# Patient Record
Sex: Female | Born: 1945 | Hispanic: No | Smoking: Never smoker
Health system: Southern US, Community
[De-identification: ages and names within clinical notes are randomized; demographics above are authoritative.]

## PROBLEM LIST (undated history)

## (undated) DIAGNOSIS — I10 Essential (primary) hypertension: Secondary | ICD-10-CM

## (undated) DIAGNOSIS — E119 Type 2 diabetes mellitus without complications: Secondary | ICD-10-CM

---

## 2004-07-23 ENCOUNTER — Emergency Department: Payer: Self-pay | Admitting: Emergency Medicine

## 2007-10-30 ENCOUNTER — Ambulatory Visit: Payer: Self-pay | Admitting: Internal Medicine

## 2008-10-02 ENCOUNTER — Ambulatory Visit: Payer: Self-pay | Admitting: Oncology

## 2008-10-08 LAB — CBC WITH DIFFERENTIAL (CANCER CENTER ONLY)
BASO#: 0 10*3/uL (ref 0.0–0.2)
EOS%: 1.8 % (ref 0.0–7.0)
Eosinophils Absolute: 0.1 10*3/uL (ref 0.0–0.5)
HCT: 37.2 % (ref 34.8–46.6)
HGB: 12.6 g/dL (ref 11.6–15.9)
MCH: 28.7 pg (ref 26.0–34.0)
MCHC: 33.9 g/dL (ref 32.0–36.0)
MONO%: 5.4 % (ref 0.0–13.0)
NEUT#: 2.6 10*3/uL (ref 1.5–6.5)
NEUT%: 47.2 % (ref 39.6–80.0)
RBC: 4.38 10*6/uL (ref 3.70–5.32)

## 2008-10-08 LAB — CMP (CANCER CENTER ONLY)
Alkaline Phosphatase: 53 U/L (ref 26–84)
BUN, Bld: 25 mg/dL — ABNORMAL HIGH (ref 7–22)
CO2: 29 mEq/L (ref 18–33)
Creat: 1.6 mg/dl — ABNORMAL HIGH (ref 0.6–1.2)
Glucose, Bld: 202 mg/dL — ABNORMAL HIGH (ref 73–118)
Sodium: 137 mEq/L (ref 128–145)
Total Bilirubin: 0.5 mg/dl (ref 0.20–1.60)
Total Protein: 8.6 g/dL — ABNORMAL HIGH (ref 6.4–8.1)

## 2008-10-08 LAB — MORPHOLOGY - CHCC SATELLITE: RBC Comments: NORMAL

## 2008-10-10 LAB — IRON AND TIBC
%SAT: 22 % (ref 20–55)
Iron: 76 ug/dL (ref 42–145)
TIBC: 349 ug/dL (ref 250–470)
UIBC: 273 ug/dL

## 2008-10-10 LAB — PROTEIN ELECTROPHORESIS, SERUM
Albumin ELP: 58.5 % (ref 55.8–66.1)
Alpha-1-Globulin: 4.1 % (ref 2.9–4.9)
Alpha-2-Globulin: 10.3 % (ref 7.1–11.8)
Beta 2: 4.6 % (ref 3.2–6.5)
Beta Globulin: 7 % (ref 4.7–7.2)
Total Protein, Serum Electrophoresis: 8 g/dL (ref 6.0–8.3)

## 2008-10-10 LAB — FERRITIN: Ferritin: 52 ng/mL (ref 10–291)

## 2008-10-10 LAB — RETICULOCYTES (CHCC): Retic Ct Pct: 1.2 % (ref 0.4–3.1)

## 2009-03-24 ENCOUNTER — Ambulatory Visit: Payer: Self-pay | Admitting: Internal Medicine

## 2013-04-26 ENCOUNTER — Emergency Department: Payer: Self-pay | Admitting: Emergency Medicine

## 2013-04-26 LAB — COMPREHENSIVE METABOLIC PANEL
Alkaline Phosphatase: 69 U/L (ref 50–136)
Calcium, Total: 10.5 mg/dL — ABNORMAL HIGH (ref 8.5–10.1)
Chloride: 98 mmol/L (ref 98–107)
Glucose: 276 mg/dL — ABNORMAL HIGH (ref 65–99)
Osmolality: 287 (ref 275–301)
SGOT(AST): 25 U/L (ref 15–37)
SGPT (ALT): 35 U/L (ref 12–78)
Sodium: 133 mmol/L — ABNORMAL LOW (ref 136–145)

## 2013-04-26 LAB — CBC
HCT: 38 % (ref 35.0–47.0)
MCH: 30 pg (ref 26.0–34.0)
Platelet: 267 10*3/uL (ref 150–440)
WBC: 8.3 10*3/uL (ref 3.6–11.0)

## 2013-04-26 LAB — URINALYSIS, COMPLETE
Glucose,UR: >=500 mg/dL (ref 0–75)
Hyaline Cast: 1
Ph: 5 (ref 4.5–8.0)
RBC,UR: 3 /HPF (ref 0–5)
Specific Gravity: 1.024 (ref 1.003–1.030)

## 2014-04-12 ENCOUNTER — Emergency Department: Payer: Self-pay | Admitting: Emergency Medicine

## 2017-04-27 ENCOUNTER — Ambulatory Visit: Admit: 2017-04-27 | Payer: Self-pay | Admitting: Ophthalmology

## 2017-04-27 SURGERY — PHACOEMULSIFICATION, CATARACT, WITH IOL INSERTION
Anesthesia: Choice | Laterality: Right

## 2017-09-19 ENCOUNTER — Emergency Department: Payer: Medicare HMO

## 2017-09-19 ENCOUNTER — Emergency Department
Admission: EM | Admit: 2017-09-19 | Discharge: 2017-09-19 | Disposition: A | Discharge status: Home / Self Care | Payer: Medicare HMO | Attending: Emergency Medicine | Admitting: Emergency Medicine

## 2017-09-19 ENCOUNTER — Other Ambulatory Visit: Payer: Self-pay

## 2017-09-19 ENCOUNTER — Encounter: Payer: Self-pay | Admitting: Emergency Medicine

## 2017-09-19 DIAGNOSIS — I10 Essential (primary) hypertension: Secondary | ICD-10-CM | POA: Diagnosis not present

## 2017-09-19 DIAGNOSIS — N390 Urinary tract infection, site not specified: Secondary | ICD-10-CM | POA: Insufficient documentation

## 2017-09-19 DIAGNOSIS — Z79899 Other long term (current) drug therapy: Secondary | ICD-10-CM | POA: Insufficient documentation

## 2017-09-19 DIAGNOSIS — Z794 Long term (current) use of insulin: Secondary | ICD-10-CM | POA: Insufficient documentation

## 2017-09-19 DIAGNOSIS — E119 Type 2 diabetes mellitus without complications: Secondary | ICD-10-CM | POA: Diagnosis not present

## 2017-09-19 DIAGNOSIS — R109 Unspecified abdominal pain: Secondary | ICD-10-CM | POA: Diagnosis present

## 2017-09-19 HISTORY — DX: Type 2 diabetes mellitus without complications: E11.9

## 2017-09-19 HISTORY — DX: Essential (primary) hypertension: I10

## 2017-09-19 LAB — BASIC METABOLIC PANEL
Anion gap: 11 (ref 5–15)
BUN: 19 mg/dL (ref 6–20)
CHLORIDE: 102 mmol/L (ref 101–111)
CO2: 24 mmol/L (ref 22–32)
CREATININE: 1.56 mg/dL — AB (ref 0.44–1.00)
Calcium: 10.4 mg/dL — ABNORMAL HIGH (ref 8.9–10.3)
GFR, EST AFRICAN AMERICAN: 37 mL/min — AB (ref >60)
GFR, EST NON AFRICAN AMERICAN: 32 mL/min — AB (ref >60)
Glucose, Bld: 89 mg/dL (ref 65–99)
POTASSIUM: 4.6 mmol/L (ref 3.5–5.1)
Sodium: 137 mmol/L (ref 135–145)

## 2017-09-19 LAB — URINALYSIS, COMPLETE (UACMP) WITH MICROSCOPIC
Bilirubin Urine: NEGATIVE
Glucose, UA: NEGATIVE mg/dL
Hgb urine dipstick: NEGATIVE
Ketones, ur: NEGATIVE mg/dL
Nitrite: NEGATIVE
Protein, ur: 100 mg/dL — AB
Specific Gravity, Urine: 1.021 (ref 1.005–1.030)
pH: 5 (ref 5.0–8.0)

## 2017-09-19 LAB — GLUCOSE, CAPILLARY: Glucose-Capillary: 109 mg/dL — ABNORMAL HIGH (ref 65–99)

## 2017-09-19 MED ORDER — SULFAMETHOXAZOLE-TRIMETHOPRIM 800-160 MG PO TABS
1.0000 | ORAL_TABLET | Freq: Two times a day (BID) | ORAL | 0 refills | Status: DC
Start: 2017-09-19 — End: 2022-09-20

## 2017-09-19 MED ORDER — TRAMADOL-ACETAMINOPHEN 37.5-325 MG PO TABS: 1.0000 | ORAL_TABLET | Freq: Four times a day (QID) | ORAL | 0 refills | Status: DC | PRN

## 2017-09-19 NOTE — ED Triage Notes (Signed)
Pt presents with left flank pain x 1 week. States it is worse at night. Pt denies any urinary symptoms, fever, chills, n/v/d. Pt alert & oriented with NAD noted.

## 2017-09-19 NOTE — ED Provider Notes (Signed)
[facility] Emergency Department Provider Note   ____________________________________________   First MD Initiated Contact with Patient 09/19/17 1109     (approximate)  I have reviewed the triage vital signs and the nursing notes.   HISTORY  Chief Complaint Flank Pain    HPI Lisa Flowers is a 71 y.o. female patient complain of left flank pain for 1 week. Patient state pain is worse at night. Patient denies urinary complaints. Patient denies fever, chills, or nausea, vomiting, or diarrhea. No palliative measures for complaint.Patient rates the pain as a 10 over 10. Patient described the pain as "achy".   Past Medical History:  Diagnosis Date  . Diabetes mellitus without complication (HCC)   . Hypertension     There are no active problems to display for this patient.   History reviewed. No pertinent surgical history.  Prior to Admission medications   Medication Sig Start Date End Date Taking? Authorizing Provider  insulin aspart (NOVOLOG) 100 UNIT/ML injection Inject into the skin 3 (three) times daily before meals.   Yes [provider]  lisinopril (PRINIVIL,ZESTRIL) 40 MG tablet Take 40 mg by mouth daily.   Yes [provider]  sulfamethoxazole-trimethoprim (BACTRIM DS,SEPTRA DS) 800-160 MG tablet Take 1 tablet by mouth 2 (two) times daily. 09/19/17   Joni ReiningSmith, Marylynn Rigdon K, PA-C  traMADol-acetaminophen (ULTRACET) 37.5-325 MG tablet Take 1 tablet by mouth every 6 (six) hours as needed. 09/19/17   Joni ReiningSmith, Janelle Spellman K, PA-C    Allergies Patient has no known allergies.  History reviewed. No pertinent family history.  Social History Social History   Tobacco Use  . Smoking status: Never Smoker  . Smokeless tobacco: Never Used  Substance Use Topics  . Alcohol use: No    Frequency: Never  . Drug use: No    Review of Systems Constitutional: No fever/chills Eyes: No visual changes. ENT: No sore throat. Cardiovascular: Denies  chest pain. Respiratory: Denies shortness of breath. Gastrointestinal: No abdominal pain.  No nausea, no vomiting.  No diarrhea.  No constipation. Genitourinary: Negative for dysuria. Musculoskeletal: Negative for back pain. Skin: Negative for rash. Neurological: Negative for headaches, focal weakness or numbness. Endocrine:Diabetes and hypertension Hematological/Lymphatic: Allergic/Immunilogical: Unknown cough medicine ____________________________________________   PHYSICAL EXAM:  VITAL SIGNS: ED Triage Vitals  Enc Vitals Group     BP 09/19/17 1003 (!) 148/85     Pulse Rate 09/19/17 1003 (!) 105     Resp 09/19/17 1003 18     Temp 09/19/17 1003 98.7 F (37.1 C)     Temp Source 09/19/17 1003 Oral     SpO2 09/19/17 1003 99 %     Weight 09/19/17 1004 160 lb (72.6 kg)     Height 09/19/17 1004 5\' 3"  (1.6 m)     Head Circumference --      Peak Flow --      Pain Score 09/19/17 1010 10     Pain Loc --      Pain Edu? --      Excl. in [place]? --    Constitutional: Alert and oriented. Well appearing and in no acute distress. Cardiovascular: Normal rate, regular rhythm. Grossly normal heart sounds.  Good peripheral circulation. Respiratory: Normal respiratory effort.  No retractions. Lungs CTAB. Gastrointestinal: Soft and nontender. No distention. No abdominal bruits. No CVA tenderness. Musculoskeletal: No lower extremity tenderness nor edema.  No joint effusions. Neurologic:  Normal speech and language. No gross focal neurologic deficits are appreciated. No gait instability. Skin:  Skin  is warm, dry and intact. No rash noted. Psychiatric: Mood and affect are normal. Speech and behavior are normal.  ____________________________________________   LABS (all labs ordered are listed, but only abnormal results are displayed)  Labs Reviewed  URINALYSIS, COMPLETE (UACMP) WITH MICROSCOPIC - Abnormal; Notable for the following components:      Result Value   Color, Urine YELLOW (*)     APPearance CLOUDY (*)    Protein, ur 100 (*)    Leukocytes, UA TRACE (*)    Bacteria, UA MANY (*)    Squamous Epithelial / LPF TOO NUMEROUS TO COUNT (*)    All other components within normal limits  GLUCOSE, CAPILLARY - Abnormal; Notable for the following components:   Glucose-Capillary 109 (*)    All other components within normal limits  URINE CULTURE  CBC WITH DIFFERENTIAL/PLATELET  BASIC METABOLIC PANEL  CBG MONITORING, ED   ____________________________________________  EKG   ____________________________________________  RADIOLOGY  Ct Renal Stone Study  Result Date: 09/19/2017 CLINICAL DATA:  Left flank pain for 1 week. EXAM: CT ABDOMEN AND PELVIS WITHOUT CONTRAST TECHNIQUE: Multidetector CT imaging of the abdomen and pelvis was performed following the standard protocol without IV contrast. COMPARISON:  None. FINDINGS: Lower chest: Aortic valvular and coronary artery calcification. Heart size normal. No pericardial or pleural effusion. Hepatobiliary: Liver appears decreased in attenuation diffusely. Focal area of decreased attenuation in segment 4 measures 2.2 x 3.1 cm. A similar but smaller lesion is seen along the falciform ligament. Liver and gallbladder are otherwise unremarkable. No biliary ductal dilatation. Pancreas: Negative. Spleen: Negative. Adrenals/Urinary Tract: Adrenal glands are unremarkable. 10 mm low-attenuation lesion in the anterior interpolar right kidney is too small to characterize. Kidneys are otherwise unremarkable. No urinary stones. Ureters are decompressed. Bladder is grossly unremarkable. Stomach/Bowel: Stomach, small bowel and colon are unremarkable. Appendix is not readily visualized. Vascular/Lymphatic: Atherosclerotic calcification of the arterial vasculature without abdominal aortic aneurysm. No pathologically enlarged lymph nodes. Reproductive: Uterus is visualized.  No adnexal mass. Other: No free fluid.  Mesenteries and peritoneum are unremarkable.  Musculoskeletal: Degenerative changes in the spine. No worrisome lytic or sclerotic lesions. IMPRESSION: 1. No urinary stones or obstruction. No acute findings to explain the patient's pain. 2. Aortic atherosclerosis (ICD10-170.0). Coronary artery calcification. 3. Hepatic steatosis.  Probable focal fat in segment 4. Electronically Signed   By: Leanna Battles M.D.   On: 09/19/2017 12:22    ____________________________________________   PROCEDURES  Procedure(s) performed: None  Procedures  Critical Care performed: No  ____________________________________________   INITIAL IMPRESSION / ASSESSMENT AND PLAN / ED COURSE  As part of my medical decision making, I reviewed the following data within the electronic MEDICAL RECORD NUMBER    Right flank pain secondary to UTI. Discussed left results with patient. Patient given discharge care instructions. Patient advised to take medication as directed and again retested in 10 days.      ____________________________________________   FINAL CLINICAL IMPRESSION(S) / ED DIAGNOSES  Final diagnoses:  Lower urinary tract infectious disease  Flank pain     ED Discharge Orders        Ordered    sulfamethoxazole-trimethoprim (BACTRIM DS,SEPTRA DS) 800-160 MG tablet  2 times daily     09/19/17 1233    traMADol-acetaminophen (ULTRACET) 37.5-325 MG tablet  Every 6 hours PRN     09/19/17 1233       Note:  This document was prepared using Dragon voice recognition software and may include unintentional dictation errors.    Katrinka Blazing,  Arther AbbottRonald K, PA-C 09/19/17 1236    Darci CurrentBrown, [place] N, MD 09/19/17 [phone]

## 2017-09-19 NOTE — ED Notes (Signed)
Pt states her dob is 1946-06-10; we have [birth year]; she states she thought it was [birth year] "until I got my birth certificate."

## 2017-09-19 NOTE — ED Notes (Signed)
See triage note  States she note some urinary freq and some flank pain  Pain is mainly at night  No fever or n/v

## 2017-09-21 LAB — URINE CULTURE: SPECIAL REQUESTS: NORMAL

## 2018-09-27 IMAGING — CT CT RENAL STONE PROTOCOL
2 of 4 series · 16 of 46 positions shown, 18 images · non-contrast
Comparison: None.

CLINICAL DATA: Left flank pain for 1 week.

EXAM:
CT ABDOMEN AND PELVIS WITHOUT CONTRAST
TECHNIQUE: Multidetector CT imaging of the abdomen and pelvis was performed
following the standard protocol without IV contrast.

[Series 2: stone full standard · axial · 0.72mm/px · z∈[-420,+0]mm · 13 of 92 slices shown, 15 images]
[im 4/92  soft-tissue]
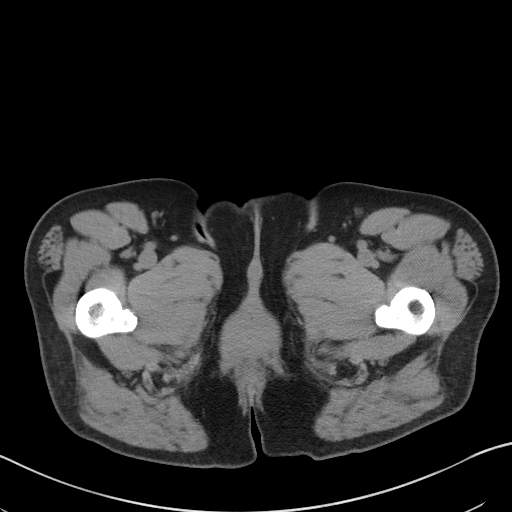
[im 4/92  bone]
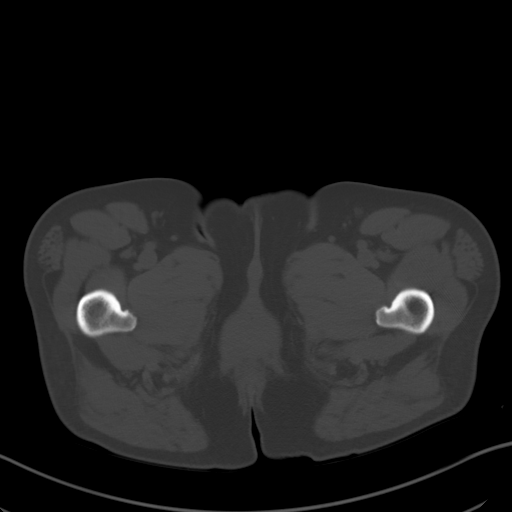
[im 12/92  soft-tissue]
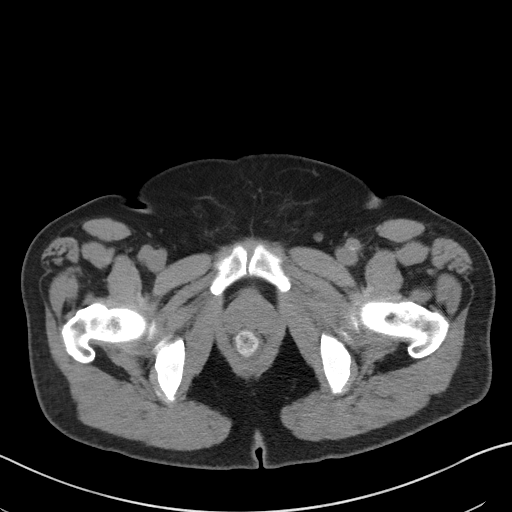
[im 20/92  soft-tissue]
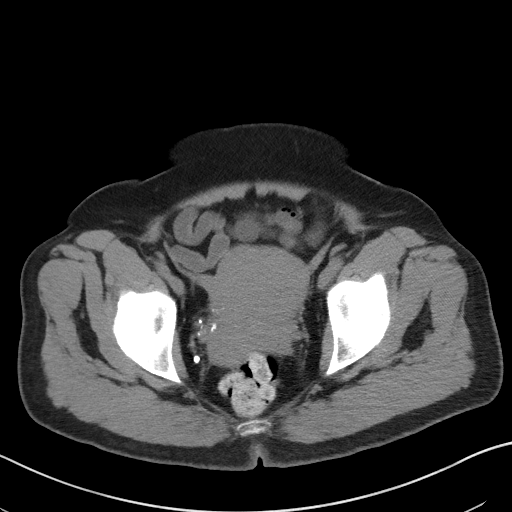
[im 24/92  soft-tissue]
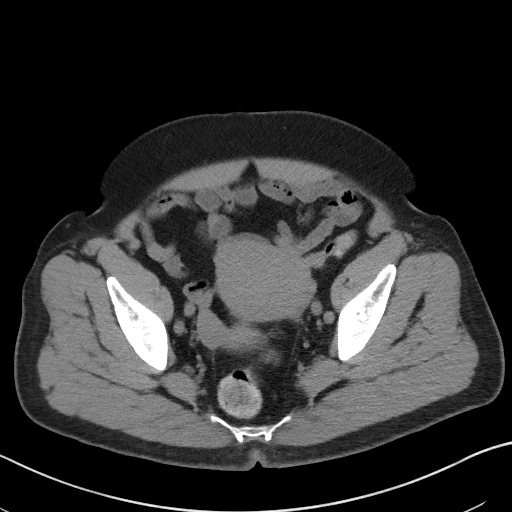
[im 32/92  soft-tissue]
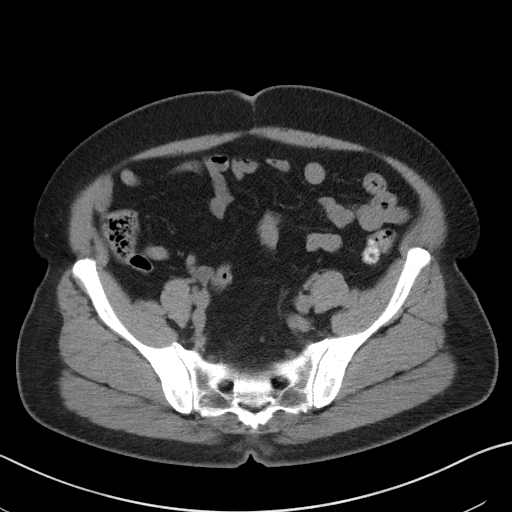
[im 40/92  soft-tissue]
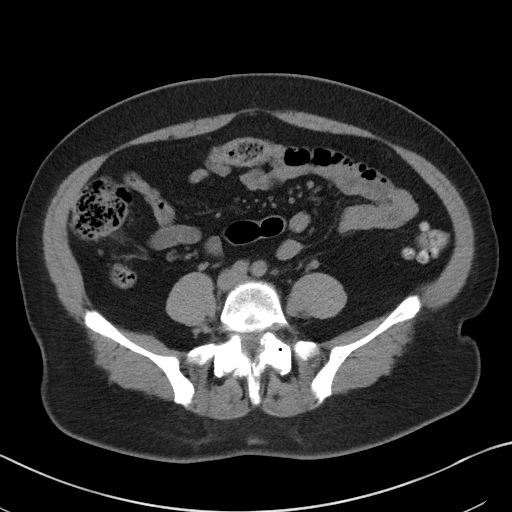
[im 48/92  soft-tissue]
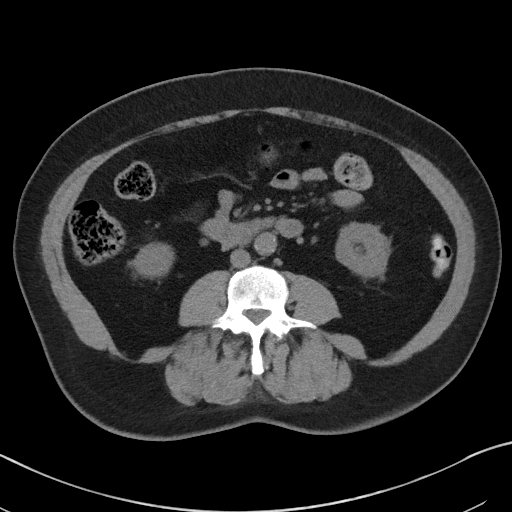
[im 52/92  soft-tissue]
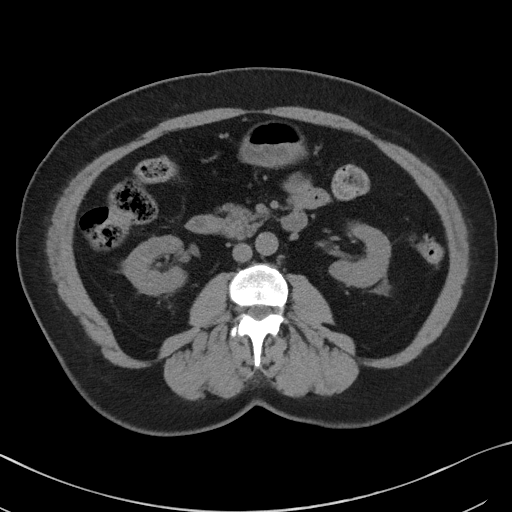
[im 60/92  soft-tissue]
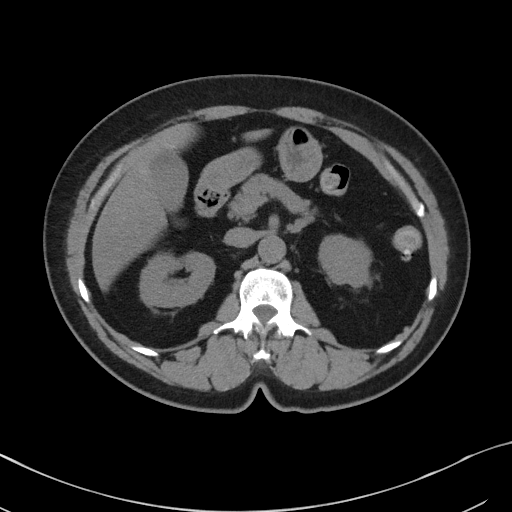
[im 60/92  bone]
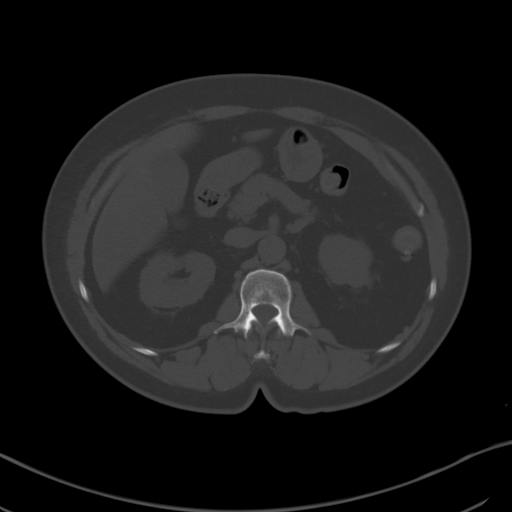
[im 68/92  soft-tissue]
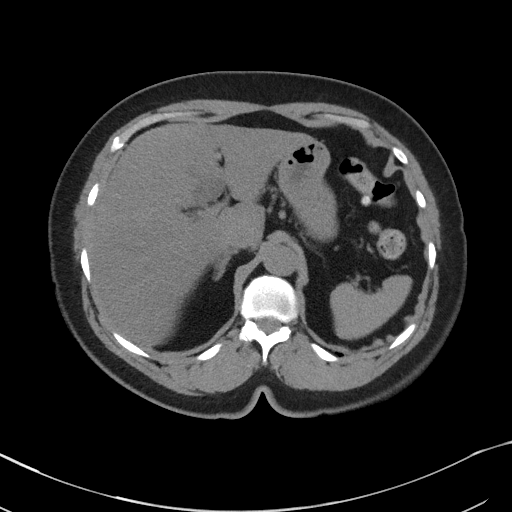
[im 72/92  soft-tissue]
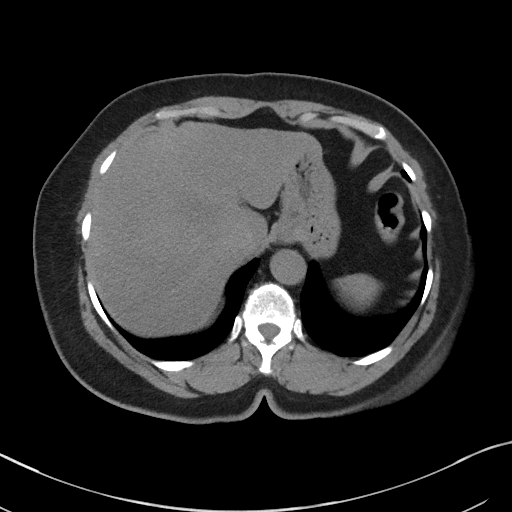
[im 80/92  soft-tissue]
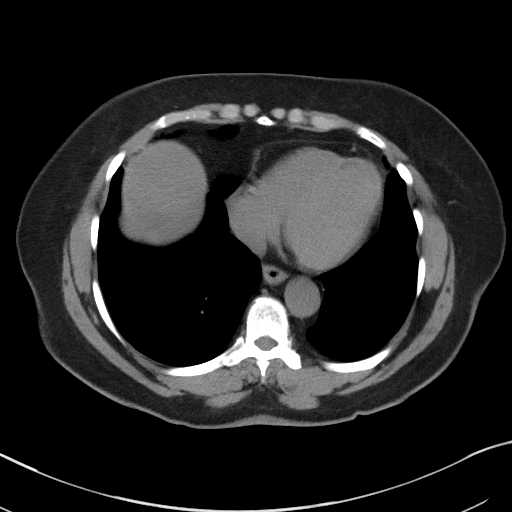
[im 88/92  soft-tissue]
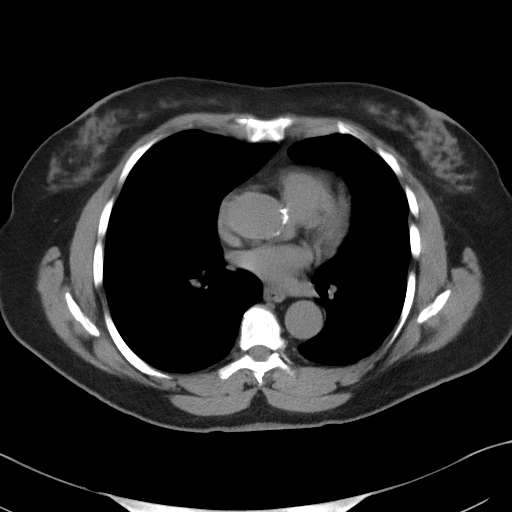

[Series 5: coronal · coronal · 0.81mm/px · 3 of 128 slices shown]
[im 43/128  soft-tissue]
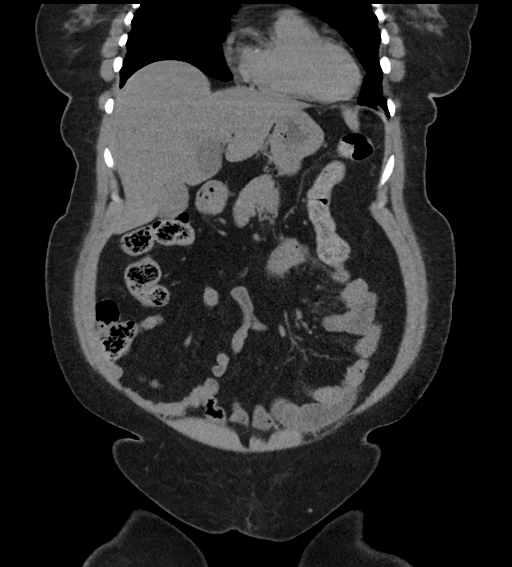
[im 57/128  soft-tissue]
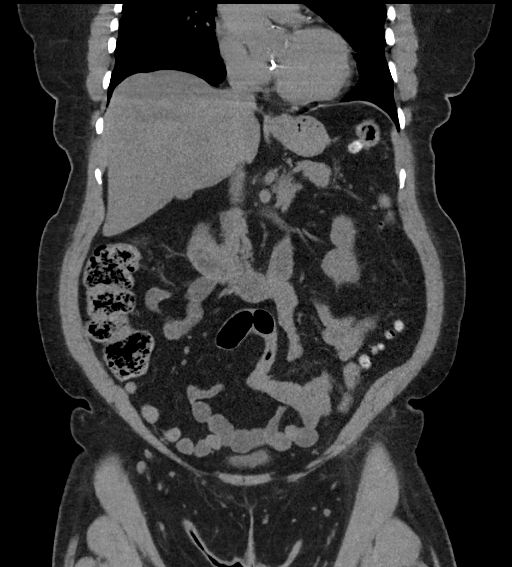
[im 71/128  soft-tissue]
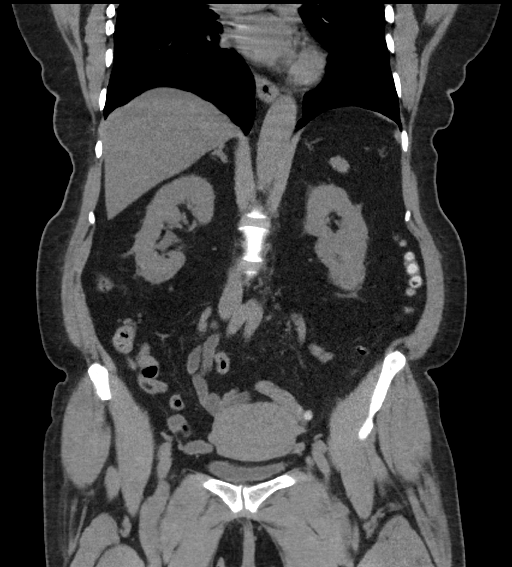

[16 of 46 positions shown; findings below may reference images not displayed]

FINDINGS: Lower chest: Aortic valvular and coronary artery calcification.
Heart size normal. No pericardial or pleural effusion.

Hepatobiliary: Liver appears decreased in attenuation diffusely.
Focal area of decreased attenuation in segment 4 measures 2.2 x
cm. A similar but smaller lesion is seen along the falciform
ligament. Liver and gallbladder are otherwise unremarkable. No
biliary ductal dilatation.

Pancreas: Negative.

Spleen: Negative.

Adrenals/Urinary Tract: Adrenal glands are unremarkable. 10 mm
low-attenuation lesion in the anterior interpolar right kidney is
too small to characterize. Kidneys are otherwise unremarkable. No
urinary stones. Ureters are decompressed. Bladder is grossly
unremarkable.

Stomach/Bowel: Stomach, small bowel and colon are unremarkable.
Appendix is not readily visualized.

Vascular/Lymphatic: Atherosclerotic calcification of the arterial
vasculature without abdominal aortic aneurysm. No pathologically
enlarged lymph nodes.

Reproductive: Uterus is visualized.  No adnexal mass.

Other: No free fluid.  Mesenteries and peritoneum are unremarkable.

Musculoskeletal: Degenerative changes in the spine. No worrisome
lytic or sclerotic lesions.
IMPRESSION: 1. No urinary stones or obstruction. No acute findings to explain
the patient's pain.
2. Aortic atherosclerosis (DVQLU-170.0). Coronary artery
calcification.
3. Hepatic steatosis.  Probable focal fat in segment 4.

## 2019-01-19 ENCOUNTER — Other Ambulatory Visit: Payer: Self-pay

## 2019-01-19 NOTE — Patient Outreach (Signed)
[facility]: [facility] W) Care Management  01/19/2019  TAMANNA TERZO 07-10-1946 [id number]   Incoming call from patient requesting that Advance Directive packet be mailed to her home.  BSW mailed Advance Directive packet and EMMI today.  Malachy Chamber, BSW Social Worker [phone]

## 2019-10-26 ENCOUNTER — Emergency Department
Admission: EM | Admit: 2019-10-26 | Discharge: 2019-10-27 | Disposition: A | Discharge status: Home / Self Care | Payer: Medicare HMO | Attending: Emergency Medicine | Admitting: Emergency Medicine

## 2019-10-26 DIAGNOSIS — Z79899 Other long term (current) drug therapy: Secondary | ICD-10-CM | POA: Diagnosis not present

## 2019-10-26 DIAGNOSIS — E11649 Type 2 diabetes mellitus with hypoglycemia without coma: Secondary | ICD-10-CM | POA: Diagnosis present

## 2019-10-26 DIAGNOSIS — E162 Hypoglycemia, unspecified: Secondary | ICD-10-CM

## 2019-10-26 DIAGNOSIS — Z794 Long term (current) use of insulin: Secondary | ICD-10-CM | POA: Insufficient documentation

## 2019-10-26 DIAGNOSIS — I10 Essential (primary) hypertension: Secondary | ICD-10-CM | POA: Diagnosis not present

## 2019-10-26 LAB — GLUCOSE, CAPILLARY
Glucose-Capillary: 137 mg/dL — ABNORMAL HIGH (ref 70–99)
Glucose-Capillary: 46 mg/dL — ABNORMAL LOW (ref 70–99)
Glucose-Capillary: 63 mg/dL — ABNORMAL LOW (ref 70–99)
Glucose-Capillary: 70 mg/dL (ref 70–99)

## 2019-10-26 NOTE — ED Notes (Signed)
Attempt iv insertion x1 without success. Dawn, rn in to attempt iv insertion.

## 2019-10-26 NOTE — ED Notes (Signed)
BG 46 relayed to DR Derrill Kay. Pt does not have an IV at ths time and staff working on securing one. OJ w 2 packs of given to pt

## 2019-10-26 NOTE — ED Provider Notes (Signed)
[facility] Emergency Department Provider Note  ____________________________________________   First MD Initiated Contact with Patient 10/26/19 2306     (approximate)  I have reviewed the triage vital signs and the nursing notes.   HISTORY  Chief Complaint No chief complaint on file.    HPI Lisa Flowers is a 74 y.o. female with a history that includes hypertension and insulin-dependent diabetes who presents for evaluation of hypoglycemia.  She has been going through a difficult time with her husband suffering from a long-term illness and he just passed away today.  She was then having a decreased level of consciousness this evening and her family checked her blood sugar and found  that her glucose was low.  EMS was called and she had a blood glucose of about 63.  She was given peanut butter and a soda and it did not significantly improve her blood glucose and the concern is that she may have taken too large of a dose of her short acting insulin or confused the short acting and the long-acting.  She says that she typically takes 60 units of long-acting insulin twice a day and then has a sliding scale of regular insulin that she takes.  She is not sure what she took tonight.  She adamantly denied that she was trying to hurt or kill herself and says she thinks she just made a mistake.  She feels a little bit better now and has no specific complaints.  She denies fever/chills, sore throat, chest pain, shortness of breath, nausea, vomiting, abdominal pain, and dysuria.  The onset of her symptoms was reportedly rather acute and severe but now she appears to be at or close to her baseline.        Past Medical History:  Diagnosis Date  . Diabetes mellitus without complication (HCC)   . Hypertension     There are no problems to display for this patient.   History reviewed. No pertinent surgical history.  Prior to Admission medications   Medication Sig Start Date  End Date Taking? Authorizing Provider  insulin aspart (NOVOLOG) 100 UNIT/ML injection Inject into the skin 3 (three) times daily before meals.    [provider]  lisinopril (PRINIVIL,ZESTRIL) 40 MG tablet Take 40 mg by mouth daily.    [provider]  sulfamethoxazole-trimethoprim (BACTRIM DS,SEPTRA DS) 800-160 MG tablet Take 1 tablet by mouth 2 (two) times daily. 09/19/17   Joni Reining, PA-C  traMADol-acetaminophen (ULTRACET) 37.5-325 MG tablet Take 1 tablet by mouth every 6 (six) hours as needed. 09/19/17   Joni Reining, PA-C    Allergies Patient has no known allergies.  History reviewed. No pertinent family history.  Social History Social History   Tobacco Use  . Smoking status: Never Smoker  . Smokeless tobacco: Never Used  Substance Use Topics  . Alcohol use: No  . Drug use: No    Review of Systems Constitutional: Hypoglycemia at home, concern for confusion between short-acting and long-acting insulin.  No fever/chills Eyes: No visual changes. ENT: No sore throat. Cardiovascular: Denies chest pain. Respiratory: Denies shortness of breath. Gastrointestinal: No abdominal pain.  No nausea, no vomiting.  No diarrhea.  No constipation. Genitourinary: Negative for dysuria. Musculoskeletal: Negative for neck pain.  Negative for back pain. Integumentary: Negative for rash. Neurological: Negative for headaches, focal weakness or numbness.   ____________________________________________   PHYSICAL EXAM:  VITAL SIGNS: ED Triage Vitals  Enc Vitals Group     BP 10/26/19  2251 (!) 151/72     Pulse Rate 10/26/19 2245 (!) 107     Resp 10/26/19 2246 20     Temp 10/26/19 2244 99.7 F (37.6 C)     Temp Source 10/26/19 2244 Oral     SpO2 10/26/19 2244 94 %     Weight --      Height --      Head Circumference --      Peak Flow --      Pain Score 10/26/19 2246 0     Pain Loc --      Pain Edu? --      Excl. in [place]? --     Constitutional: Alert and  oriented.  No acute distress. Eyes: Conjunctivae are normal.  Head: Atraumatic. Nose: No congestion/rhinnorhea. Mouth/Throat: Patient is wearing a mask. Neck: No stridor.  No meningeal signs.   Cardiovascular: Normal rate, regular rhythm. Good peripheral circulation. Grossly normal heart sounds. Respiratory: Normal respiratory effort.  No retractions. Gastrointestinal: Soft and nontender. No distention.  Musculoskeletal: No lower extremity tenderness nor edema. No gross deformities of extremities. Neurologic:  Normal speech and language. No gross focal neurologic deficits are appreciated.  Skin:  Skin is warm, dry and intact. Psychiatric: Mood and affect are normal. Speech and behavior are normal.  She responds slowly but appropriately, does not appear to be confused, and adamantly denies suicidal ideation.  She says that she stays with her daughter and has family support.  ____________________________________________   LABS (all labs ordered are listed, but only abnormal results are displayed)  Labs Reviewed  GLUCOSE, CAPILLARY - Abnormal; Notable for the following components:      Result Value   Glucose-Capillary 46 (*)    All other components within normal limits  GLUCOSE, CAPILLARY - Abnormal; Notable for the following components:   Glucose-Capillary 63 (*)    All other components within normal limits  COMPREHENSIVE METABOLIC PANEL - Abnormal; Notable for the following components:   Sodium 133 (*)    Potassium 3.3 (*)    CO2 20 (*)    Glucose, Bld 205 (*)    Creatinine, Ser 1.53 (*)    Calcium 8.5 (*)    Albumin 2.9 (*)    AST 52 (*)    GFR calc non Af Amer 33 (*)    GFR calc Af Amer 38 (*)    All other components within normal limits  GLUCOSE, CAPILLARY - Abnormal; Notable for the following components:   Glucose-Capillary 137 (*)    All other components within normal limits  GLUCOSE, CAPILLARY - Abnormal; Notable for the following components:   Glucose-Capillary 195  (*)    All other components within normal limits  CBC WITH DIFFERENTIAL/PLATELET  GLUCOSE, CAPILLARY  URINALYSIS, ROUTINE W REFLEX MICROSCOPIC   ____________________________________________  EKG  No indication for EKG ____________________________________________  RADIOLOGY Ursula Alert, personally viewed and evaluated these images (plain radiographs) as part of my medical decision making, as well as reviewing the written report by the radiologist.  ED MD interpretation: No indication for emergent imaging  Official radiology report(s): No results found.  ____________________________________________   PROCEDURES   Procedure(s) performed (including Critical Care):  Procedures   ____________________________________________   INITIAL IMPRESSION / MDM / [place] / ED COURSE  As part of my medical decision making, I reviewed the following data within the [place] notes reviewed and incorporated, Labs reviewed  ,  Old chart reviewed and Notes from prior ED  visits   Differential diagnosis includes, but is not limited to, accidental insulin overdose, metabolic or electrolyte abnormality, intentional overdose due to depression and suicidal ideation.  The patient is in no distress at this time and is acting appropriate.  Reportedly her family expressed some concerns about "failure to thrive" due to the patient's husband's recent passing, but at this point the main concern is her hypoglycemia.  I will check basic labs and we will monitor her blood glucose carefully to see if it is continuing to drop.  If so she may require a dextrose infusion and admission to the hospital for further observation until the insulin is out of her system, but she does not meet criteria for involuntary commitment nor for psychiatric evaluation or admission.  Patient agrees with the current plan.      Clinical Course as of Oct 27 203  Sat Oct 27, 2019  0203 The  patient's lab work is reassuring.  No significant leukocytosis, normal hemoglobin.  Comprehensive metabolic panel is generally reassuring; she has very mild hyponatremia and hypokalemia but her glucose is up to 205 which is reassuring because she has not been eating anything else in the ED.  Her creatinine is 1.53 which is right around her baseline; the last creatinine on record was 1.56.  She has been observed for 3-1/2 hours and I do not believe that she needs to be admitted or stay longer since her blood sugars trending steadily upwards.  She is awake and alert, in no acute distress and says that she would like to go home.  Her family can help look after tonight and given her loss today it is important that she is home with family rather than stuck in the emergency department.  I recommended she follow-up with her regular doctor and I gave my usual and customary return precautions.   [CF]    Clinical Course User Index [CF] Loleta Rose, MD     ____________________________________________  FINAL CLINICAL IMPRESSION(S) / ED DIAGNOSES  Final diagnoses:  Hypoglycemia     MEDICATIONS GIVEN DURING THIS VISIT:  Medications - No data to display   ED Discharge Orders    None      *Please note:  Zyriah Mask Maclellan was evaluated in Emergency Department on 10/27/2019 for the symptoms described in the history of present illness. She was evaluated in the context of the global COVID-19 pandemic, which necessitated consideration that the patient might be at risk for infection with the SARS-CoV-2 virus that causes COVID-19. Institutional protocols and algorithms that pertain to the evaluation of patients at risk for COVID-19 are in a state of rapid change based on information released by regulatory bodies including the CDC and federal and state organizations. These policies and algorithms were followed during the patient's care in the ED.  Some ED evaluations and interventions may be delayed as a result of  limited staffing during the pandemic.*  Note:  This document was prepared using Dragon voice recognition software and may include unintentional dictation errors.   Loleta Rose, MD 10/27/19 [phone]

## 2019-10-26 NOTE — ED Notes (Signed)
BG 63 and 22G PIV  replayed to DR Derrill Kay

## 2019-10-26 NOTE — ED Triage Notes (Addendum)
BIB EMS from home CO hypoglycemia 63. Family is worried that pt might have mistakenly  taken the wrong dose of insulin today. Given peanut butter and coke w/o change(63). Given oral glucose but pt has barely touched it.  BP 158/96, 104, 4F, 100% RA Pt husband passed away today and family is worried about failure to thrive Hx DM, HTN

## 2019-10-27 LAB — CBC WITH DIFFERENTIAL/PLATELET
Abs Immature Granulocytes: 0.05 10*3/uL (ref 0.00–0.07)
Basophils Absolute: 0 10*3/uL (ref 0.0–0.1)
Basophils Relative: 0 %
Eosinophils Absolute: 0 10*3/uL (ref 0.0–0.5)
Eosinophils Relative: 0 %
HCT: 36.7 % (ref 36.0–46.0)
Hemoglobin: 12 g/dL (ref 12.0–15.0)
Immature Granulocytes: 1 %
Lymphocytes Relative: 18 %
Lymphs Abs: 1.3 10*3/uL (ref 0.7–4.0)
MCH: 28.4 pg (ref 26.0–34.0)
MCHC: 32.7 g/dL (ref 30.0–36.0)
MCV: 86.8 fL (ref 80.0–100.0)
Monocytes Absolute: 0.6 10*3/uL (ref 0.1–1.0)
Monocytes Relative: 9 %
Neutro Abs: 5.2 10*3/uL (ref 1.7–7.7)
Neutrophils Relative %: 72 %
Platelets: 258 10*3/uL (ref 150–400)
RBC: 4.23 MIL/uL (ref 3.87–5.11)
RDW: 13.5 % (ref 11.5–15.5)
WBC: 7.1 10*3/uL (ref 4.0–10.5)
nRBC: 0 % (ref 0.0–0.2)

## 2019-10-27 LAB — COMPREHENSIVE METABOLIC PANEL
ALT: 33 U/L (ref 0–44)
AST: 52 U/L — ABNORMAL HIGH (ref 15–41)
Albumin: 2.9 g/dL — ABNORMAL LOW (ref 3.5–5.0)
Alkaline Phosphatase: 43 U/L (ref 38–126)
Anion gap: 14 (ref 5–15)
BUN: 16 mg/dL (ref 8–23)
CO2: 20 mmol/L — ABNORMAL LOW (ref 22–32)
Calcium: 8.5 mg/dL — ABNORMAL LOW (ref 8.9–10.3)
Chloride: 99 mmol/L (ref 98–111)
Creatinine, Ser: 1.53 mg/dL — ABNORMAL HIGH (ref 0.44–1.00)
GFR calc Af Amer: 38 mL/min — ABNORMAL LOW (ref >60)
GFR calc non Af Amer: 33 mL/min — ABNORMAL LOW (ref >60)
Glucose, Bld: 205 mg/dL — ABNORMAL HIGH (ref 70–99)
Potassium: 3.3 mmol/L — ABNORMAL LOW (ref 3.5–5.1)
Sodium: 133 mmol/L — ABNORMAL LOW (ref 135–145)
Total Bilirubin: 0.4 mg/dL (ref 0.3–1.2)
Total Protein: 7.3 g/dL (ref 6.5–8.1)

## 2019-10-27 LAB — GLUCOSE, CAPILLARY: Glucose-Capillary: 195 mg/dL — ABNORMAL HIGH (ref 70–99)

## 2019-10-27 NOTE — ED Notes (Signed)
Daughter called and updated about pt status. Daughter on the way to pick up pt.

## 2019-10-27 NOTE — Discharge Instructions (Signed)
As we discussed, we watched you for 3 and half hours in the emergency department, and fortunately your blood sugar is trending upward.  You may have taken too much insulin or the wrong kind of insulin, but it should be out of your system by now.  I recommend that you eat a small meal or snack when she got home and before you go to sleep and then recheck your blood sugar in the morning before you take any insulin.  Be careful about the medications that you take we can continue taking your regular medication and follow-up with your doctor with a phone call at the next available opportunity for a follow-up appointment.  Return to the emergency department if you develop new or worsening symptoms that concern you.

## 2019-10-27 NOTE — ED Notes (Signed)
Lab at bedside

## 2019-10-27 NOTE — ED Notes (Signed)
Phlebotomist x2 unable to obtain blood. Ann,r n in to attempt venipuncture.

## 2020-12-18 DIAGNOSIS — E785 Hyperlipidemia, unspecified: Secondary | ICD-10-CM | POA: Diagnosis not present

## 2020-12-18 DIAGNOSIS — I129 Hypertensive chronic kidney disease with stage 1 through stage 4 chronic kidney disease, or unspecified chronic kidney disease: Secondary | ICD-10-CM | POA: Diagnosis not present

## 2020-12-18 DIAGNOSIS — Z0001 Encounter for general adult medical examination with abnormal findings: Secondary | ICD-10-CM | POA: Diagnosis not present

## 2020-12-18 DIAGNOSIS — Z794 Long term (current) use of insulin: Secondary | ICD-10-CM | POA: Diagnosis not present

## 2020-12-18 DIAGNOSIS — N182 Chronic kidney disease, stage 2 (mild): Secondary | ICD-10-CM | POA: Diagnosis not present

## 2020-12-18 DIAGNOSIS — E1122 Type 2 diabetes mellitus with diabetic chronic kidney disease: Secondary | ICD-10-CM | POA: Diagnosis not present

## 2020-12-18 DIAGNOSIS — Z23 Encounter for immunization: Secondary | ICD-10-CM | POA: Diagnosis not present

## 2020-12-30 DIAGNOSIS — E1165 Type 2 diabetes mellitus with hyperglycemia: Secondary | ICD-10-CM | POA: Diagnosis not present

## 2020-12-30 DIAGNOSIS — N1832 Chronic kidney disease, stage 3b: Secondary | ICD-10-CM | POA: Diagnosis not present

## 2020-12-30 DIAGNOSIS — Z794 Long term (current) use of insulin: Secondary | ICD-10-CM | POA: Diagnosis not present

## 2020-12-30 DIAGNOSIS — E785 Hyperlipidemia, unspecified: Secondary | ICD-10-CM | POA: Diagnosis not present

## 2020-12-30 DIAGNOSIS — I10 Essential (primary) hypertension: Secondary | ICD-10-CM | POA: Diagnosis not present

## 2022-09-20 ENCOUNTER — Other Ambulatory Visit: Payer: Self-pay

## 2022-09-20 ENCOUNTER — Emergency Department
Admission: EM | Admit: 2022-09-20 | Discharge: 2022-09-21 | Disposition: A | Discharge status: Home / Self Care | Payer: Medicare Other | Attending: Emergency Medicine | Admitting: Emergency Medicine

## 2022-09-20 ENCOUNTER — Encounter: Payer: Self-pay | Admitting: *Deleted

## 2022-09-20 DIAGNOSIS — F03918 Unspecified dementia, unspecified severity, with other behavioral disturbance: Secondary | ICD-10-CM | POA: Diagnosis not present

## 2022-09-20 DIAGNOSIS — Z79899 Other long term (current) drug therapy: Secondary | ICD-10-CM | POA: Diagnosis not present

## 2022-09-20 DIAGNOSIS — R4689 Other symptoms and signs involving appearance and behavior: Secondary | ICD-10-CM | POA: Diagnosis not present

## 2022-09-20 DIAGNOSIS — R456 Violent behavior: Secondary | ICD-10-CM | POA: Diagnosis present

## 2022-09-20 DIAGNOSIS — I10 Essential (primary) hypertension: Secondary | ICD-10-CM | POA: Diagnosis not present

## 2022-09-20 DIAGNOSIS — E1165 Type 2 diabetes mellitus with hyperglycemia: Secondary | ICD-10-CM | POA: Insufficient documentation

## 2022-09-20 LAB — URINALYSIS, ROUTINE W REFLEX MICROSCOPIC
Bilirubin Urine: NEGATIVE
Glucose, UA: >=500 mg/dL — AB
Hgb urine dipstick: NEGATIVE
Ketones, ur: 5 mg/dL — AB
Leukocytes,Ua: NEGATIVE
Nitrite: NEGATIVE
Protein, ur: 30 mg/dL — AB
Specific Gravity, Urine: 1.024 (ref 1.005–1.030)
pH: 5 (ref 5.0–8.0)

## 2022-09-20 LAB — CBC
HCT: 42 % (ref 36.0–46.0)
Hemoglobin: 13.2 g/dL (ref 12.0–15.0)
MCH: 28.4 pg (ref 26.0–34.0)
MCHC: 31.4 g/dL (ref 30.0–36.0)
MCV: 90.5 fL (ref 80.0–100.0)
Platelets: 279 10*3/uL (ref 150–400)
RBC: 4.64 MIL/uL (ref 3.87–5.11)
RDW: 12.7 % (ref 11.5–15.5)
WBC: 9.6 10*3/uL (ref 4.0–10.5)
nRBC: 0 % (ref 0.0–0.2)

## 2022-09-20 LAB — URINE DRUG SCREEN, QUALITATIVE (ARMC ONLY)
Amphetamines, Ur Screen: NOT DETECTED
Barbiturates, Ur Screen: NOT DETECTED
Benzodiazepine, Ur Scrn: NOT DETECTED
Cannabinoid 50 Ng, Ur ~~LOC~~: NOT DETECTED
Cocaine Metabolite,Ur ~~LOC~~: NOT DETECTED
MDMA (Ecstasy)Ur Screen: NOT DETECTED
Methadone Scn, Ur: NOT DETECTED
Opiate, Ur Screen: NOT DETECTED
Phencyclidine (PCP) Ur S: NOT DETECTED
Tricyclic, Ur Screen: NOT DETECTED

## 2022-09-20 LAB — COMPREHENSIVE METABOLIC PANEL
ALT: 29 U/L (ref 0–44)
AST: 34 U/L (ref 15–41)
Albumin: 4.3 g/dL (ref 3.5–5.0)
Alkaline Phosphatase: 62 U/L (ref 38–126)
Anion gap: 16 — ABNORMAL HIGH (ref 5–15)
BUN: 31 mg/dL — ABNORMAL HIGH (ref 8–23)
CO2: 18 mmol/L — ABNORMAL LOW (ref 22–32)
Calcium: 10.2 mg/dL (ref 8.9–10.3)
Chloride: 98 mmol/L (ref 98–111)
Creatinine, Ser: 1.67 mg/dL — ABNORMAL HIGH (ref 0.44–1.00)
GFR, Estimated: 31 mL/min — ABNORMAL LOW (ref >60)
Glucose, Bld: 489 mg/dL — ABNORMAL HIGH (ref 70–99)
Potassium: 5 mmol/L (ref 3.5–5.1)
Sodium: 132 mmol/L — ABNORMAL LOW (ref 135–145)
Total Bilirubin: 0.7 mg/dL (ref 0.3–1.2)
Total Protein: 8.3 g/dL — ABNORMAL HIGH (ref 6.5–8.1)

## 2022-09-20 LAB — BETA-HYDROXYBUTYRIC ACID: Beta-Hydroxybutyric Acid: 1.16 mmol/L — ABNORMAL HIGH (ref 0.05–0.27)

## 2022-09-20 LAB — CBG MONITORING, ED
Glucose-Capillary: 408 mg/dL — ABNORMAL HIGH (ref 70–99)
Glucose-Capillary: 238 mg/dL — ABNORMAL HIGH (ref 70–99)
Glucose-Capillary: 305 mg/dL — ABNORMAL HIGH (ref 70–99)

## 2022-09-20 LAB — SALICYLATE LEVEL: Salicylate Lvl: <7 mg/dL — ABNORMAL LOW (ref 7.0–30.0)

## 2022-09-20 LAB — ETHANOL: Alcohol, Ethyl (B): <10 mg/dL (ref <10)

## 2022-09-20 LAB — ACETAMINOPHEN LEVEL: Acetaminophen (Tylenol), Serum: <10 ug/mL — ABNORMAL LOW (ref 10–30)

## 2022-09-20 MED ORDER — SODIUM CHLORIDE 0.9 % IV BOLUS
1000.0000 mL | Freq: Once | INTRAVENOUS | Status: AC
  Administered 2022-09-20: 1000 mL via INTRAVENOUS

## 2022-09-20 MED ORDER — INSULIN ASPART 100 UNIT/ML IJ SOLN
8.0000 [IU] | Freq: Once | INTRAMUSCULAR | Status: AC
  Administered 2022-09-20: 8 [IU] via SUBCUTANEOUS
  Filled 2022-09-20: qty 1

## 2022-09-20 MED ORDER — INSULIN ASPART 100 UNIT/ML IJ SOLN
5.0000 [IU] | Freq: Once | INTRAMUSCULAR | Status: AC
  Administered 2022-09-20: 5 [IU] via INTRAVENOUS
  Filled 2022-09-20: qty 1

## 2022-09-20 MED ORDER — INSULIN ASPART 100 UNIT/ML IJ SOLN: 8.0000 [IU] | Freq: Once | INTRAMUSCULAR | Status: DC
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
## 2022-09-20 NOTE — ED Triage Notes (Signed)
Pt brought in by [employer].  Pt is IVC.  Pt has dementia and is not taking her meds per IVC paper work.  Pt loud, but cooperative. Pt taken to 20h for triage and dress out.

## 2022-09-20 NOTE — ED Notes (Signed)
Pt provided cup of water per her request

## 2022-09-20 NOTE — ED Notes (Addendum)
Pt resting on stretcher with IV fluids infusing. Pt having to be reminded to keep her arm straight due to fluids running slowly when arm is bent. Verbalized understanding. No distress noted and pt currently has no complaints.
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
## 2022-09-20 NOTE — ED Notes (Signed)
Behavioral med nurse practitioner at the bedside for pt evaluation

## 2022-09-20 NOTE — ED Notes (Signed)
Dr. Modesto Charon at the bedside for pt evaluation. Pt cursing and states she doesn't know why she is still sitting here and she wants to go home.
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
## 2022-09-20 NOTE — BH Assessment (Signed)
Comprehensive Clinical Assessment (CCA) Note  09/20/2022 Lisa Flowers [id number]  Chief Complaint: Patient is a 76 year old female presenting to [facility] ED under IVC. Per triage note Pt brought in by BPD. Pt is IVC. Pt has dementia and is not taking her meds per IVC paper work. Pt loud, but cooperative. Pt taken to 20h for triage and dress out. During assessment patient appears alert and oriented x4, cooperative but somewhat irritable. Patient reports being upset due to "the police brought me here against my will due to my daughter." Patient reports that she lives with her daughter and denies any issues with her mental health, she also denies any aggressive behaviors towards her daughter.  Collateral information provided by the patient's daughter [name] [phone] who reports "my mom has dementia and she got to the point where I couldn't handle her, she was cussing and threatening me." Patient's daughter reports that she does not want her mother placed in a facility "I want her to get her own place because she does well on her own,  I'm only there to help pay her bills." Patient's daughter is requesting assistance from social work to assist the patient with help paying her bills and keeping her at home.   Per Psyc NP Elenore Paddy patient is psyc cleared, referred to [facility] Chief Complaint  Patient presents with   Psychiatric Evaluation   Visit Diagnosis: Dementia     CCA Screening, Triage and Referral (STR)  Patient Reported Information How did you hear about [place]? Legal System  Referral name: No data recorded Referral phone number: No data recorded  Whom do you see for routine medical problems? No data recorded Practice/Facility Name: No data recorded Practice/Facility Phone Number: No data recorded Name of Contact: No data recorded Contact Number: No data recorded Contact Fax Number: No data recorded Prescriber Name: No data recorded Prescriber Address (if known): No data  recorded  What Is the Reason for Your Visit/Call Today? Pt brought in by BPD.  Pt is IVC.  Pt has dementia and is not taking her meds per IVC paper work.  Pt loud, but cooperative. Pt taken to 20h for triage and dress out  How Long Has This Been Causing You Problems? > than 6 months  What Do You Feel Would Help You the Most Today? No data recorded  Have You Recently Been in Any Inpatient Treatment (Hospital/Detox/Crisis Center/28-Day Program)? No data recorded Name/Location of Program/Hospital:No data recorded How Long Were You There? No data recorded When Were You Discharged? No data recorded  Have You Ever Received Services From [facility] Before? No data recorded Who Do You See at [facility]? No data recorded  Have You Recently Had Any Thoughts About Hurting Yourself? No  Are You Planning to Commit Suicide/Harm Yourself At This time? No   Have you Recently Had Thoughts About Hurting Someone Karolee Ohs? No  Explanation: No data recorded  Have You Used Any Alcohol or Drugs in the Past 24 Hours? No  How Long Ago Did You Use Drugs or Alcohol? No data recorded What Did You Use and How Much? No data recorded  Do You Currently Have a Therapist/Psychiatrist? No  Name of Therapist/Psychiatrist: No data recorded  Have You Been Recently Discharged From Any Office Practice or Programs? No  Explanation of Discharge From Practice/Program: No data recorded    CCA Screening Triage Referral Assessment Type of Contact: Face-to-Face  Is this Initial or Reassessment? No data recorded Date Telepsych consult ordered in  CHL:  No data recorded Time Telepsych consult ordered in CHL:  No data recorded  Patient Reported Information Reviewed? No data recorded Patient Left Without Being Seen? No data recorded Reason for Not Completing Assessment: No data recorded  Collateral Involvement: No data recorded  Does Patient Have a Court Appointed Legal Guardian? No data recorded Name and Contact of  Legal Guardian: No data recorded If Minor and Not Living with Parent(s), Who has Custody? No data recorded Is CPS involved or ever been involved? Never  Is APS involved or ever been involved? Never   Patient Determined To Be At Risk for Harm To Self or Others Based on Review of Patient Reported Information or Presenting Complaint? No  Method: No data recorded Availability of Means: No data recorded Intent: No data recorded Notification Required: No data recorded Additional Information for Danger to Others Potential: No data recorded Additional Comments for Danger to Others Potential: No data recorded Are There Guns or Other Weapons in Your Home? No data recorded Types of Guns/Weapons: No data recorded Are These Weapons Safely Secured?                            No data recorded Who Could Verify You Are Able To Have These Secured: No data recorded Do You Have any Outstanding Charges, Pending Court Dates, Parole/Probation? No data recorded Contacted To Inform of Risk of Harm To Self or Others: No data recorded  Location of Assessment: [facility] ED   Does Patient Present under Involuntary Commitment? Yes  IVC Papers Initial File Date: No data recorded  [place] of Residence: [place]   Patient Currently Receiving the Following Services: No data recorded  Determination of Need: Emergent (2 hours)   Options For Referral: No data recorded    CCA Biopsychosocial Intake/Chief Complaint:  No data recorded Current Symptoms/Problems: No data recorded  Patient Reported Schizophrenia/Schizoaffective Diagnosis in Past: No   Strengths: Patient is able to communicate her needs  Preferences: No data recorded Abilities: No data recorded  Type of Services Patient Feels are Needed: No data recorded  Initial Clinical Notes/Concerns: No data recorded  Mental Health Symptoms Depression:   None   Duration of Depressive symptoms: No data recorded  Mania:   None   Anxiety:     Irritability; Restlessness   Psychosis:   None   Duration of Psychotic symptoms: No data recorded  Trauma:   None   Obsessions:   None   Compulsions:   "Driven" to perform behaviors/acts; Poor Insight   Inattention:   None   Hyperactivity/Impulsivity:   None   Oppositional/Defiant Behaviors:   Angry; Spiteful; Temper   Emotional Irregularity:   Intense/inappropriate anger   Other Mood/Personality Symptoms:  No data recorded   Mental Status Exam Appearance and self-care  Stature:   Average   Weight:   Average weight   Clothing:   Casual   Grooming:   Normal   Cosmetic use:   None   Posture/gait:   Normal   Motor activity:   Not Remarkable   Sensorium  Attention:   Normal   Concentration:   Normal   Orientation:   X5   Recall/memory:   Normal   Affect and Mood  Affect:   Appropriate   Mood:   Irritable   Relating  Eye contact:   Normal   Facial expression:   Responsive   Attitude toward examiner:   Cooperative   Thought and  Language  Speech flow:  Clear and Coherent   Thought content:   Appropriate to Mood and Circumstances   Preoccupation:   None   Hallucinations:   None   Organization:  No data recorded  [employer] of Knowledge:   Fair   Intelligence:   Average   Abstraction:   Normal   Judgement:   Fair   Dance movement psychotherapist:   Adequate   Insight:   Lacking   Decision Making:   Impulsive   Social Functioning  Social Maturity:   Responsible   Social Judgement:   Normal   Stress  Stressors:   Family conflict   Coping Ability:   Normal   Skill Deficits:   Interpersonal   Supports:   Family     Religion: Religion/Spirituality Are You A Religious Person?: No  Leisure/Recreation: Leisure / Recreation Do You Have Hobbies?: No  Exercise/Diet: Exercise/Diet Do You Exercise?: No Have You Gained or Lost A Significant Amount of Weight in the Past Six Months?: No Do  You Follow a Special Diet?: No Do You Have Any Trouble Sleeping?: No   CCA Employment/Education Employment/Work Situation: Employment / 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 has Been Impacted by Current Illness: No Has Patient ever Been in Equities trader?: No  Education: Education Is Patient Currently Attending School?: No Did You Have An Individualized Education Program (IIEP): No Did You Have Any Difficulty At [employer]?: No Patient's Education Has Been Impacted by Current Illness: No   CCA Family/Childhood History Family and Relationship History: Family history Marital status: Single Does patient have children?: Yes How many children?: 4 How is patient's relationship with their children?: Patient has a good relationship with her sons but has a strained relationship with her daughter  Childhood History:  Childhood History Did patient suffer any verbal/emotional/physical/sexual abuse as a child?: No Did patient suffer from severe childhood neglect?: No Has patient ever been sexually abused/assaulted/raped as an adolescent or adult?: No Was the patient ever a victim of a crime or a disaster?: No Witnessed domestic violence?: No Has patient been affected by domestic violence as an adult?: No  Child/Adolescent Assessment:     CCA Substance Use Alcohol/Drug Use: Alcohol / Drug Use Pain Medications: See MAR Prescriptions: See MAR Over the Counter: See MAR History of alcohol / drug use?: No history of alcohol / drug abuse                         ASAM's:  Six Dimensions of Multidimensional Assessment  Dimension 1:  Acute Intoxication and/or Withdrawal Potential:      Dimension 2:  Biomedical Conditions and Complications:      Dimension 3:  Emotional, Behavioral, or Cognitive Conditions and Complications:     Dimension 4:  Readiness to Change:     Dimension 5:  Relapse, Continued use, or Continued Problem Potential:     Dimension 6:   Recovery/Living Environment:     ASAM Severity Score:    ASAM Recommended Level of Treatment:     Substance use Disorder (SUD)    Recommendations for Services/Supports/Treatments:    DSM5 Diagnoses: Patient Active Problem List   Diagnosis Date Noted   Dementia with behavioral disturbance (HCC) 09/20/2022    Patient Centered Plan: Patient is on the following Treatment Plan(s):  Impulse Control   Referrals to Alternative Service(s): Referred to Alternative Service(s):   Place:   Date:   Time:    Referred to  Alternative Service(s):   Place:   Date:   Time:    Referred to Alternative Service(s):   Place:   Date:   Time:    Referred to Alternative Service(s):   Place:   Date:   Time:      '@BHCOLLABOFCARE'$ @  [employer], LCAS-A

## 2022-09-20 NOTE — ED Provider Notes (Signed)
Emergency Medicine Observation Re-evaluation Note  Lisa Flowers is a 76 y.o. female, seen on rounds today.  Pt initially presented to the ED for complaints of Psychiatric Evaluation  Currently, the patient is resting- plan to recheck BMP and possible dc in AM  Physical Exam  Blood pressure 135/73, pulse 88, temperature 98.1 F (36.7 C), resp. rate 16, height 5\' 3"  (1.6 m), weight 70.3 kg, SpO2 97 %.  Physical Exam General: No apparent distress Pulm: Normal WOB Psych: resting     ED Course / MDM     I have reviewed the labs performed to date as well as medications administered while in observation.  Recent changes in the last 24 hours include insulin fluids given  Plan   Current plan is to continue to wait for recheck bmp  Patient is under full IVC at this time.  Repeat blood work shows glucose of 200.  Anion gap is normal.  Therefore patient is now medically cleared to be dispositioned by psych   , MD 09/21/22 [phone]

## 2022-09-21 LAB — CBG MONITORING, ED
Glucose-Capillary: 256 mg/dL — ABNORMAL HIGH (ref 70–99)
Glucose-Capillary: 310 mg/dL — ABNORMAL HIGH (ref 70–99)
Glucose-Capillary: 371 mg/dL — ABNORMAL HIGH (ref 70–99)
Glucose-Capillary: 359 mg/dL — ABNORMAL HIGH (ref 70–99)
Glucose-Capillary: 389 mg/dL — ABNORMAL HIGH (ref 70–99)
Glucose-Capillary: 375 mg/dL — ABNORMAL HIGH (ref 70–99)

## 2022-09-21 LAB — BASIC METABOLIC PANEL
Anion gap: 7 (ref 5–15)
BUN: 27 mg/dL — ABNORMAL HIGH (ref 8–23)
CO2: 26 mmol/L (ref 22–32)
Calcium: 9.8 mg/dL (ref 8.9–10.3)
Chloride: 104 mmol/L (ref 98–111)
Creatinine, Ser: 1.52 mg/dL — ABNORMAL HIGH (ref 0.44–1.00)
GFR, Estimated: 35 mL/min — ABNORMAL LOW (ref >60)
Glucose, Bld: 207 mg/dL — ABNORMAL HIGH (ref 70–99)
Potassium: 4.5 mmol/L (ref 3.5–5.1)
Sodium: 137 mmol/L (ref 135–145)

## 2022-09-21 MED ORDER — PANTOPRAZOLE SODIUM 20 MG PO TBEC
20.0000 mg | DELAYED_RELEASE_TABLET | Freq: Once | ORAL | Status: AC
  Administered 2022-09-21: 20 mg via ORAL
  Filled 2022-09-21: qty 1

## 2022-09-21 MED ORDER — LISINOPRIL 10 MG PO TABS
20.0000 mg | ORAL_TABLET | Freq: Once | ORAL | Status: AC
  Administered 2022-09-21: 20 mg via ORAL
  Filled 2022-09-21: qty 2

## 2022-09-21 MED ORDER — AMLODIPINE BESYLATE 5 MG PO TABS
10.0000 mg | ORAL_TABLET | Freq: Once | ORAL | Status: AC
  Administered 2022-09-21: 10 mg via ORAL
  Filled 2022-09-21: qty 2

## 2022-09-21 MED ORDER — BUSPIRONE HCL 5 MG PO TABS
5.0000 mg | ORAL_TABLET | Freq: Once | ORAL | Status: AC
  Administered 2022-09-21: 5 mg via ORAL
  Filled 2022-09-21: qty 1

## 2022-09-21 MED ORDER — INSULIN ASPART 100 UNIT/ML IJ SOLN
10.0000 [IU] | Freq: Once | INTRAMUSCULAR | Status: AC
  Administered 2022-09-21: 10 [IU] via SUBCUTANEOUS
  Filled 2022-09-21: qty 1

## 2022-09-21 MED ORDER — EMPAGLIFLOZIN 10 MG PO TABS
10.0000 mg | ORAL_TABLET | Freq: Once | ORAL | Status: AC
  Administered 2022-09-21: 10 mg via ORAL
  Filled 2022-09-21: qty 1

## 2022-09-21 MED ORDER — INSULIN ASPART 100 UNIT/ML IJ SOLN
7.0000 [IU] | Freq: Once | INTRAMUSCULAR | Status: AC
  Administered 2022-09-21: 7 [IU] via SUBCUTANEOUS
  Filled 2022-09-21: qty 1

## 2022-09-21 NOTE — ED Notes (Signed)
EDP Siadecki informed of elevated blood sugar.

## 2022-09-21 NOTE — ED Notes (Signed)
EDP Siadecki informed of repeat CBG

## 2022-09-21 NOTE — ED Notes (Signed)
Pt given lunch tray and drink 

## 2022-09-21 NOTE — Discharge Instructions (Addendum)
Rent/Utility/Housing  Agency Name: [facility] Address: [address] [place], [place] [address] Phone: [phone] Email: [email or web address] Website: www.alamanceservices.org Service(s) Offered: Housing services, self-sufficiency, congregate meal  program, weatherization program, heating appliance  repair/replacement program, emergency food assistance,  housing counseling, home ownership program, wheels -towork program.  Agency Name: [facility] Address: [address], [place], [place] [address] Phone: [phone] (8a-4p) [phone] (8p- 10p) Email: [email or web address] Website: [email or web address].[email or web address] Service(s) Offered: A program for homeless and/or needy men that includes one-on-one counseling, life skills training and job rehabilitation.  Agency Name: [facility] Address: [address], [place], [place] [address] Phone: [phone] Website: www.[facility].org Service(s) Offered: Assistance to needy in emergency with utility bills, heating  fuel, and prescriptions. Shelter for homeless 7pm-7am. January 27, 2017 15  Agency Name: [facility] Address: [address], [place], [place] [address] Phone: [phone] Contact Person: Wayne Dawson Email: [email or web address] Website: www.arcnc.org Service(s) Offered: Helps individuals with developmental disabilities move  from housing that is more restrictive to homes where they  can achieve greater independence and have more  opportunities.  Agency Name: [facility] Address: [address], [place], [place] [address] Phone: [phone] Email: [email or web address] Website: www.[facility].org Service(s) Offered: Provides affordable housing for low-income families,  elderly, and disabled individuals. Offer a wide range of  programs and services, from financial planning  to afterschool and summer programs.  Agency Name: [facility] Address: [address] [place], [place], [place] [address] Phone: [phone] Service(s) Offered: Child support services; child welfare services; food stamps;  Medicaid; work first family assistance; and aid with fuel,  rent, food and medicine.  Agency Name: [facility] Address: [facility] [place], [place]  [address] Phone: [phone] Website: www.familyabuseservices.org Service(s) Offered: 24 hour Crisis Line: 226-5985; 24 hour Emergency Shelter;  Transitional Housing; Support Groups; Court Advocacy;  Community Education; Hispanic Outreach: 228-9040;  Visitation Center: [phone]. January 27, 2017 16  Agency Name: [facility] Address: [address] [place], [place] [address] Phone: [phone] Service(s) Offered: CAP Services; Home and Community Supports; Individual  or Group Supports; Respite Care Non-Institutional Nursing;  Residential Supports; Respite Care and Personal Care  Services; Transportation; Family and Friends Night;  Recreational Activities; Three Nutritious Meals/Snacks;  Consultation with Registered Dietician; Twenty-four hour  Registered Nurse Access; Daily and Community Living  Skills; Camp [employer]; Summer Camp for the Special  Population (During Summer Months) Bingo Night (Every  Wednesday Night); Special Populations Dance Night  (Every Tuesday Night); Professional Hair Care Services.  Agency Name: [facility] Address: [address], [place], [place] [address] Phone: [phone] Contact Person: Gloria McCauley Website: [email or web address] Service(s) Offered: Residential treatment facility for women; food and  clothing, educational & employment development and  transportation to work; development of financial skills;  parenting and family  reunification; emotional and spiritual  support; transitional housing for program graduates.  Agency Name: [place] [facility] Address: [address], [place], [place] [address] Phone: [phone] Email: [email or web address] Website: [email or web address] Service(s) Offered: Public housing units for elderly, disabled, and low income  people; housing choice vouchers for income eligible  applicants; shelter plus care vouchers; and HOPWA  voucher program. January 27, 2017 17  Agency Name: [facility] Address: [address], [place],  [address] Phone: [phone] Email: habitat1@netzero.net Website: www.habitatalamance.org Service(s) Offered: Build houses for families in need of decent housing. Each    adult in the family must invest 200 hours of labor on  someone else's house, work with volunteers to build their  own house, attend classes on budgeting, home maintenance, yard care, and attend homeowner association  meetings.  Agency Name: [facility] Address: [address], [place], [place] [address] Phone: [phone] Website: www.rsli.org Service(s) Offered: Intermediate care facilities for mentally retarded,  Supervised Living in group homes for adults with  developmental disabilities, Supervised Living for people  who have dual diagnoses (MRMI), Independent Living,  Supported Living, respite and a variety of CAP services,  pre-vocational services, day supports, and Community  Support Services.  Agency Name: [place] [employer] Phone: [phone] Website: www.[place]CForeclosurePrevention.gov Service(s) Offered: Zero-interest, deferred loans to homeowners struggling to  pay their mortgage. Call for more information  

## 2022-09-21 NOTE — ED Provider Notes (Addendum)
-----------------------------------------   12:17 PM on 09/21/2022 -----------------------------------------  The patient was cleared by psychiatry and does not meet criteria for inpatient admission at this time.  IVC was rescinded.  She does not demonstrate acute danger to self or others.  She had a recurrent hypoglycemia this morning although based on labs last night there was no evidence of DKA.  Insulin has been given.  The patient is stable for discharge home at this time and her family members coming to pick her up.  Return precautions provided.   ----------------------------------------- 1:08 PM on 09/21/2022 -----------------------------------------  On reassessment, the glucose is still elevated but the patient is completely asymptomatic and appears well.  She would like to go home now.  We have given additional insulin.  At this time there is no evidence of DKA or other acute complication of her hyperglycemia and I feel that it is reasonable for her to go home and resume her normal medications.   Dionne Bucy, MD 09/21/22 1309

## 2022-09-21 NOTE — ED Notes (Signed)
EDP notified of most recent CBG. Pt states that she usually has CBG over 300 at home.

## 2022-09-21 NOTE — ED Notes (Signed)
Pt called grandson Alinda Money, to come and pick her up for discharge.

## 2022-09-21 NOTE — ED Notes (Signed)
Breakfast given.  

## 2022-09-21 NOTE — ED Notes (Signed)
Daughter aware that grandson, Alinda Money is picking up patient. Returned all of pt's belongings. Verified correct patient and correct discharge papers given. Pt alert and oriented X 4, stable for discharge. RR even and unlabored, color WNL. Discussed discharge instructions and follow-up as directed. Discharge medications discussed, when prescribed. Pt had opportunity to ask questions, and RN available to provide patient and/or family education. Given resources provided by case management at discharge.

## 2022-09-21 NOTE — ED Notes (Signed)
EDP notified of most recent CBG.

## 2022-09-21 NOTE — Inpatient Diabetes Management (Addendum)
Inpatient Diabetes Program Recommendations  AACE/ADA: New Consensus Statement on Inpatient Glycemic Control (2015)  Target Ranges:  Prepandial:   less than 140 mg/dL      Peak postprandial:   less than 180 mg/dL (1-2 hours)      Critically ill patients:  140 - 180 mg/dL   Lab Results  Component Value Date   GLUCAP 371 (H) 09/21/2022    Review of Glycemic Control  Latest Reference Range & Units 09/20/22 19:51 09/20/22 22:38 09/20/22 23:20 09/21/22 04:02 09/21/22 07:56 09/21/22 09:52  Glucose-Capillary 70 - 99 mg/dL 976 (H) 734 (H) 193 (H) 256 (H) 310 (H) 371 (H)  (H): Data is abnormally high  Diabetes history: DM2 Outpatient Diabetes medications:  70/30 70 units QAM, 50 units QPM Jardiance 10 mg QD Current orders for Inpatient glycemic control: None  Inpatient Diabetes Program Recommendations:    If patient remains in ED, please consider:  Semglee 20 units QD Novolog 0-15 units TID and 0-5 units QHS  Will continue to follow while inpatient.  Thank you, Dulce Sellar, MSN, CDCES Diabetes Coordinator Inpatient Diabetes Program [phone] (team pager from 8a-5p)   Will continue to follow while inpatient.  Thank you, Dulce Sellar, MSN, CDCES Diabetes Coordinator Inpatient Diabetes Program [phone] (team pager from 8a-5p)

## 2022-09-21 NOTE — ED Notes (Signed)
Social Worker Robbie Lis notified that pt daughter requested copy of resource packet sent with pt.  She states that pt will not allow her to see the packet. SW provided with daughter contact info, [name], [phone].

## 2022-09-21 NOTE — ED Notes (Signed)
Glucose checked 389

## 2022-09-21 NOTE — ED Notes (Signed)
EDP aware of latest CBG reading. Pt states she is ready to go, grandson at bedside. Pt voices understanding of importance of her regular medications at home.

## 2022-09-21 NOTE — TOC Initial Note (Signed)
Transition of Care [facility]) - Initial/Assessment Note    Patient Details  Name: Lisa Flowers MRN: [id number] Date of Birth: [birth year]  Transition [facility]) CM/SW Contact:    Lisa Butcher, RN Phone Number: 09/21/2022, 10:48 AM  Clinical Narrative:                  Patient was brought into the hospital by [employer] under IVC, her daughter took out paperwork saying that patient has dementia and not taking her medications.  Patient has been psychiatrically cleared for discharge.  RNCM called and spoke with patient's daughter, Lisa Flowers.  Lisa Flowers reports that she will be moving out soon that she can no longer live with her mom.  She will be gone on the 1st and would like some resources to help her mom when she leaves.  She say that her mom will not be able to afford to live alone.  Housing/utility resources added to AVS.  Also printed out [employer] for [facility] which includes information for [employer] and home care agencies.  Called and left a message with [facility] to see if they might be able to call daughter and offer guidance on placement if needed.  Patient has Medicare and Medicaid and SS income.  Daughter says that her mother's check will only cover the rent at the home and then she wont have enough for utilities.  She does not just want to abandon her but they are not getting along. Daughter will come and pick patient up this afternoon.  When RNCM tells patient that daughter, Lisa Flowers coming to pick her up she is not happy, she says she wants the daughter out of her house and she is done with her. Right now the daughter is her only way home and still lives with her. Asked the patient to try and reconcile.   Expected Discharge Plan: Home/Self Care Barriers to Discharge: Barriers Resolved   Patient Goals and CMS Choice Patient states their goals for this hospitalization and ongoing recovery are:: to go home      Expected Discharge Plan and  Services Expected Discharge Plan: Home/Self Care   Discharge Planning Services: CM Consult, Other - See comment (community resources)   Living arrangements for the past 2 months: Single Family Home                 DME Arranged: N/A DME Agency: NA       HH Arranged: NA HH Agency: NA        Prior Living Arrangements/Services Living arrangements for the past 2 months: Single Family Home Lives with:: Adult Children Patient language and need for interpreter reviewed:: Yes Do you feel safe going back to the place where you live?: Yes      Need for Family Participation in Patient Care: Yes (Comment) Care giver support system in place?: Yes (comment)   Criminal Activity/Legal Involvement Pertinent to Current Situation/Hospitalization: No - Comment as needed  Activities of Daily Living      Permission Sought/Granted Permission sought to share information with : Family Supports    Share Information with NAME: Lisa Flowers     Permission granted to share info w Relationship: daughter  Permission granted to share info w Contact Information: [phone]  Emotional Assessment Appearance:: Appears stated age Attitude/Demeanor/Rapport: Engaged Affect (typically observed): Accepting Orientation: : Oriented to Self, Oriented to Place, Oriented to  Time, Oriented to Situation Alcohol / Substance Use: Not Applicable Psych Involvement: Yes (comment)  Admission diagnosis:  IVC Patient Active Problem List   Diagnosis Date Noted   Dementia with behavioral disturbance (HCC) 09/20/2022   Aggression aggravated 09/20/2022   PCP:  Lisa Nurse, MD Pharmacy:  No Pharmacies Listed    Social Determinants of Health (SDOH) Interventions    Readmission Risk Interventions     No data to display

## 2023-12-01 ENCOUNTER — Emergency Department
Admission: EM | Admit: 2023-12-01 | Discharge: 2023-12-02 | Disposition: A | Discharge status: Home / Self Care | Payer: 59 | Attending: Emergency Medicine | Admitting: Emergency Medicine

## 2023-12-01 ENCOUNTER — Other Ambulatory Visit: Payer: Self-pay

## 2023-12-01 DIAGNOSIS — E119 Type 2 diabetes mellitus without complications: Secondary | ICD-10-CM | POA: Insufficient documentation

## 2023-12-01 DIAGNOSIS — F03918 Unspecified dementia, unspecified severity, with other behavioral disturbance: Secondary | ICD-10-CM

## 2023-12-01 DIAGNOSIS — I10 Essential (primary) hypertension: Secondary | ICD-10-CM | POA: Diagnosis not present

## 2023-12-01 DIAGNOSIS — Z794 Long term (current) use of insulin: Secondary | ICD-10-CM | POA: Diagnosis not present

## 2023-12-01 DIAGNOSIS — R456 Violent behavior: Secondary | ICD-10-CM | POA: Diagnosis not present

## 2023-12-01 DIAGNOSIS — Z79899 Other long term (current) drug therapy: Secondary | ICD-10-CM | POA: Insufficient documentation

## 2023-12-01 DIAGNOSIS — R451 Restlessness and agitation: Secondary | ICD-10-CM | POA: Diagnosis present

## 2023-12-01 DIAGNOSIS — F03911 Unspecified dementia, unspecified severity, with agitation: Secondary | ICD-10-CM

## 2023-12-01 DIAGNOSIS — F0390 Unspecified dementia without behavioral disturbance: Secondary | ICD-10-CM | POA: Diagnosis not present

## 2023-12-01 LAB — COMPREHENSIVE METABOLIC PANEL
ALT: 20 U/L (ref 0–44)
AST: 23 U/L (ref 15–41)
Albumin: 4.2 g/dL (ref 3.5–5.0)
Alkaline Phosphatase: 48 U/L (ref 38–126)
Anion gap: 19 — ABNORMAL HIGH (ref 5–15)
BUN: 31 mg/dL — ABNORMAL HIGH (ref 8–23)
CO2: 14 mmol/L — ABNORMAL LOW (ref 22–32)
Calcium: 9.6 mg/dL (ref 8.9–10.3)
Chloride: 106 mmol/L (ref 98–111)
Creatinine, Ser: 1.99 mg/dL — ABNORMAL HIGH (ref 0.44–1.00)
GFR, Estimated: 25 mL/min — ABNORMAL LOW (ref >60)
Glucose, Bld: 309 mg/dL — ABNORMAL HIGH (ref 70–99)
Potassium: 3.7 mmol/L (ref 3.5–5.1)
Sodium: 139 mmol/L (ref 135–145)
Total Bilirubin: 0.8 mg/dL (ref 0.0–1.2)
Total Protein: 8.3 g/dL — ABNORMAL HIGH (ref 6.5–8.1)

## 2023-12-01 LAB — CBC
HCT: 43.5 % (ref 36.0–46.0)
Hemoglobin: 13.9 g/dL (ref 12.0–15.0)
MCH: 29.2 pg (ref 26.0–34.0)
MCHC: 32 g/dL (ref 30.0–36.0)
MCV: 91.4 fL (ref 80.0–100.0)
Platelets: 306 10*3/uL (ref 150–400)
RBC: 4.76 MIL/uL (ref 3.87–5.11)
RDW: 12.7 % (ref 11.5–15.5)
WBC: 9.4 10*3/uL (ref 4.0–10.5)
nRBC: 0 % (ref 0.0–0.2)

## 2023-12-01 LAB — ETHANOL: Alcohol, Ethyl (B): <10 mg/dL (ref <10)

## 2023-12-01 LAB — SALICYLATE LEVEL: Salicylate Lvl: <7 mg/dL — ABNORMAL LOW (ref 7.0–30.0)

## 2023-12-01 LAB — CBG MONITORING, ED
Glucose-Capillary: 275 mg/dL — ABNORMAL HIGH (ref 70–99)
Glucose-Capillary: 239 mg/dL — ABNORMAL HIGH (ref 70–99)

## 2023-12-01 LAB — ACETAMINOPHEN LEVEL: Acetaminophen (Tylenol), Serum: <10 ug/mL — ABNORMAL LOW (ref 10–30)

## 2023-12-01 MED ORDER — ALUM & MAG HYDROXIDE-SIMETH 200-200-20 MG/5ML PO SUSP: 30.0000 mL | Freq: Four times a day (QID) | ORAL | Status: DC | PRN

## 2023-12-01 MED ORDER — INSULIN ASPART PROT & ASPART (70-30 MIX) 100 UNIT/ML ~~LOC~~ SUSP: 50.0000 [IU] | Freq: Two times a day (BID) | SUBCUTANEOUS | Status: DC

## 2023-12-01 MED ORDER — ONDANSETRON HCL 4 MG PO TABS: 4.0000 mg | ORAL_TABLET | Freq: Three times a day (TID) | ORAL | Status: DC | PRN

## 2023-12-01 MED ORDER — BUSPIRONE HCL 10 MG PO TABS
5.0000 mg | ORAL_TABLET | Freq: Three times a day (TID) | ORAL | Status: DC
  Administered 2023-12-01 – 2023-12-02 (×3): 5 mg via ORAL
  Filled 2023-12-01 (×3): qty 1

## 2023-12-01 MED ORDER — EMPAGLIFLOZIN 10 MG PO TABS
10.0000 mg | ORAL_TABLET | Freq: Every day | ORAL | Status: DC
  Administered 2023-12-02: 10 mg via ORAL
  Filled 2023-12-01: qty 1
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
## 2023-12-01 NOTE — ED Notes (Signed)
 IVC pending consult  all papers in e court and computer  case no. Received

## 2023-12-01 NOTE — ED Notes (Signed)
 Pt sitting in chair watching television. No distress noted at this time.

## 2023-12-01 NOTE — ED Notes (Signed)
 Pt heard loudly demanding to be released to go home. Registration and police officer at the bedside.

## 2023-12-01 NOTE — ED Notes (Signed)
 Informed MD of patients CBG/glucose levels earlier in the day so asked if we should be checking sugars on a regular basis or giving any medication. Retook CBG and it was 239 which MD was informed of. MD stated patient seems to be an uncontrolled DM2 from patient hx and notes but will order medication based on what patient currently takes.
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
## 2023-12-01 NOTE — ED Notes (Signed)
 Patients television in current room was not working so moved patient to another room with a working tv.
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
## 2023-12-01 NOTE — ED Notes (Signed)
 Pt starting to become restless and wanting to leave

## 2023-12-01 NOTE — ED Notes (Addendum)
 Pt changed into psych approved clothing.   Pt belongings include Red sweater Pink shirt Blue pants White socks White shoes Gray bra Black underwear Black and [employer] Wig  Pt purse included... 25 dollars in cash Glasses Tylenol Vitamin d3 Lotion Mirtazapine 15mg  Albuterol inhailer Cell phone Purse was looked through with this RN as Mykia,ED Tech.

## 2023-12-02 DIAGNOSIS — F03911 Unspecified dementia, unspecified severity, with agitation: Secondary | ICD-10-CM

## 2023-12-02 DIAGNOSIS — R451 Restlessness and agitation: Secondary | ICD-10-CM | POA: Diagnosis not present

## 2023-12-02 LAB — BASIC METABOLIC PANEL
Anion gap: 8 (ref 5–15)
BUN: 34 mg/dL — ABNORMAL HIGH (ref 8–23)
CO2: 22 mmol/L (ref 22–32)
Calcium: 9.6 mg/dL (ref 8.9–10.3)
Chloride: 108 mmol/L (ref 98–111)
Creatinine, Ser: 1.9 mg/dL — ABNORMAL HIGH (ref 0.44–1.00)
GFR, Estimated: 27 mL/min — ABNORMAL LOW (ref >60)
Glucose, Bld: 187 mg/dL — ABNORMAL HIGH (ref 70–99)
Potassium: 4.6 mmol/L (ref 3.5–5.1)
Sodium: 138 mmol/L (ref 135–145)

## 2023-12-02 LAB — URINE DRUG SCREEN, QUALITATIVE (ARMC ONLY)
Amphetamines, Ur Screen: NOT DETECTED
Barbiturates, Ur Screen: NOT DETECTED
Benzodiazepine, Ur Scrn: NOT DETECTED
Cannabinoid 50 Ng, Ur ~~LOC~~: NOT DETECTED
Cocaine Metabolite,Ur ~~LOC~~: NOT DETECTED
MDMA (Ecstasy)Ur Screen: NOT DETECTED
Methadone Scn, Ur: NOT DETECTED
Opiate, Ur Screen: NOT DETECTED
Phencyclidine (PCP) Ur S: NOT DETECTED
Tricyclic, Ur Screen: NOT DETECTED

## 2023-12-02 LAB — URINALYSIS, W/ REFLEX TO CULTURE (INFECTION SUSPECTED)
Bilirubin Urine: NEGATIVE
Glucose, UA: >=500 mg/dL — AB
Hgb urine dipstick: NEGATIVE
Ketones, ur: 5 mg/dL — AB
Leukocytes,Ua: NEGATIVE
Nitrite: NEGATIVE
Protein, ur: NEGATIVE mg/dL
Specific Gravity, Urine: 1.025 (ref 1.005–1.030)
pH: 5 (ref 5.0–8.0)

## 2023-12-02 LAB — BETA-HYDROXYBUTYRIC ACID: Beta-Hydroxybutyric Acid: 0.39 mmol/L — ABNORMAL HIGH (ref 0.05–0.27)

## 2023-12-02 LAB — CBG MONITORING, ED
Glucose-Capillary: 228 mg/dL — ABNORMAL HIGH (ref 70–99)
Glucose-Capillary: 207 mg/dL — ABNORMAL HIGH (ref 70–99)

## 2023-12-02 MED ORDER — INSULIN ASPART PROT & ASPART (70-30 MIX) 100 UNIT/ML ~~LOC~~ SUSP
50.0000 [IU] | Freq: Every day | SUBCUTANEOUS | Status: DC
  Administered 2023-12-02: 50 [IU] via SUBCUTANEOUS
  Filled 2023-12-02: qty 10

## 2023-12-02 MED ORDER — TRAZODONE HCL 100 MG PO TABS
100.0000 mg | ORAL_TABLET | Freq: Once | ORAL | Status: AC
  Administered 2023-12-02: 100 mg via ORAL
  Filled 2023-12-02: qty 1

## 2023-12-02 MED ORDER — INSULIN ASPART PROT & ASPART (70-30 MIX) 100 UNIT/ML ~~LOC~~ SUSP
70.0000 [IU] | Freq: Every day | SUBCUTANEOUS | Status: DC
  Administered 2023-12-02: 70 [IU] via SUBCUTANEOUS
  Filled 2023-12-02: qty 10
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
## 2023-12-02 NOTE — ED Notes (Signed)
 Patient given dinner tray.

## 2023-12-02 NOTE — ED Notes (Signed)
 Pt asked to have TV turned on. This tech turned on the TV for the patient. The patient has no other requests or questions at this time.

## 2023-12-02 NOTE — ED Notes (Signed)
 Patient's daughter, [name], called to transport patient back home.

## 2023-12-02 NOTE — Discharge Instructions (Signed)
 Please continue to have outpatient follow-up with your primary care provider not with from an agitation standpoint for ongoing management of her diabetes.

## 2023-12-02 NOTE — BH Assessment (Deleted)
 TTS spoke with [facility] ([phone]) and patient is on priority wait list.

## 2023-12-02 NOTE — BH Assessment (Signed)
 Spoke with patient's daughter and obtained collateral information

## 2023-12-02 NOTE — ED Notes (Addendum)
 Patient reported she ate 1/4 meal tray and didn't want the rest because it was "cold"

## 2023-12-02 NOTE — ED Notes (Signed)
 IVC/ pending consult/ moved to [facility]

## 2023-12-02 NOTE — ED Notes (Signed)
 Lunch tray was given to pt. Pt informed this tech that they didn't eat breakfast because she didn't want it. This tech asked if the pt would like her breakfast tray discarded. Pt said yes. Breakfast tray discarded.

## 2023-12-02 NOTE — ED Provider Notes (Signed)
-----------------------------------------   5:33 PM on 12/02/2023 -----------------------------------------   Blood pressure (!) 145/72, pulse 99, temperature 98.5 F (36.9 C), temperature source Oral, resp. rate 18, weight 73.9 kg, SpO2 95%.  Patient was cleared by the psychiatry team and no longer needs hospitalization or IVC.  IVC rescinded.  Patient otherwise medically clear with stable blood glucose and no signs of DKA.  Will discharge with daughter back home.   Janith Lima, MD 12/02/23 [phone]

## 2023-12-02 NOTE — ED Provider Notes (Signed)
 Emergency Medicine Observation Re-evaluation Note  Lisa Flowers is a 78 y.o. female, seen on rounds today.  Pt initially presented to the ED for complaints of Mental Health Problem  Currently, the patient is no acute distress. Pt asleep no issues per bhu nurse  Physical Exam  Blood pressure (!) 153/86, pulse 78, temperature 98.2 F (36.8 C), temperature source Oral, resp. rate 16, weight 73.9 kg, SpO2 95%.  Physical Exam General: No apparent distress Pulm: Normal WOB Psych: resting     ED Course / MDM     I have reviewed the labs performed to date as well as medications administered while in observation.  Recent changes in the last 24 hours include elevated sugar  Plan   Current plan is to continue to wait for psych plan/placement if felt warranted  Patient is under full IVC at this time.   Concha Se, MD 12/02/23 [phone]
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
## 2023-12-02 NOTE — Inpatient Diabetes Management (Addendum)
 Inpatient Diabetes Program Recommendations  AACE/ADA: New Consensus Statement on Inpatient Glycemic Control (2015)  Target Ranges:  Prepandial:   less than 140 mg/dL      Peak postprandial:   less than 180 mg/dL (1-2 hours)      Critically ill patients:  140 - 180 mg/dL   Lab Results  Component Value Date   GLUCAP 228 (H) 12/02/2023    Review of Glycemic Control  Latest Reference Range & Units 12/01/23 15:37 12/01/23 22:30 12/02/23 09:12  Glucose-Capillary 70 - 99 mg/dL 161 (H) 096 (H) 045 (H)    Latest Reference Range & Units 12/01/23 11:44  CO2 22 - 32 mmol/L 14 (L)  Glucose 70 - 99 mg/dL 409 (H)  BUN 8 - 23 mg/dL 31 (H)  Creatinine 8.11 - 1.00 mg/dL 9.14 (H)  Calcium 8.9 - 10.3 mg/dL 9.6  Anion gap 5 - 15  19 (H)  (L): Data is abnormally low (H): Data is abnormally high Diabetes history: Type 2 DM Outpatient Diabetes medications: Novolog 70/30 70 units QA, 50 units QP, Jardiance 10 mg QD Current orders for Inpatient glycemic control: Novolog 70/30 70 units QA, 50 units QP, Jardiance 10 mg QD  Inpatient Diabetes Program Recommendations:    Noted labs from yesterday.   Consider: -Adding A1C, BMET and beta hydroxybutyric acid to recheck to ensure lack of acidosis. If labs indicate acidosis on recheck would consider adding IV insulin  In the event labs are WNL would consider reducing Novolog 70/30 to 30 units BID, adding Novolog 0-9 units TID & HS and changing diet to carb modified (if able). Secure chat sent to MD and RN.   Thanks, Lujean Rave, MSN, RNC-OB Diabetes Coordinator [phone] (8a-5p),

## 2023-12-03 LAB — HEMOGLOBIN A1C
Hgb A1c MFr Bld: 8.8 % — ABNORMAL HIGH (ref 4.8–5.6)
Mean Plasma Glucose: 205.86 mg/dL

## 2024-06-06 DIAGNOSIS — I10 Essential (primary) hypertension: Secondary | ICD-10-CM | POA: Diagnosis not present

## 2024-06-06 DIAGNOSIS — Z794 Long term (current) use of insulin: Secondary | ICD-10-CM | POA: Diagnosis not present

## 2024-06-06 DIAGNOSIS — F02B0 Dementia in other diseases classified elsewhere, moderate, without behavioral disturbance, psychotic disturbance, mood disturbance, and anxiety: Secondary | ICD-10-CM | POA: Diagnosis not present

## 2024-06-06 DIAGNOSIS — N182 Chronic kidney disease, stage 2 (mild): Secondary | ICD-10-CM | POA: Diagnosis not present

## 2024-06-06 DIAGNOSIS — E1122 Type 2 diabetes mellitus with diabetic chronic kidney disease: Secondary | ICD-10-CM | POA: Diagnosis not present

## 2024-06-06 DIAGNOSIS — G309 Alzheimer's disease, unspecified: Secondary | ICD-10-CM | POA: Diagnosis not present

## 2024-06-12 DIAGNOSIS — E113299 Type 2 diabetes mellitus with mild nonproliferative diabetic retinopathy without macular edema, unspecified eye: Secondary | ICD-10-CM | POA: Diagnosis not present

## 2024-06-12 DIAGNOSIS — I10 Essential (primary) hypertension: Secondary | ICD-10-CM | POA: Diagnosis not present

## 2024-06-12 DIAGNOSIS — R062 Wheezing: Secondary | ICD-10-CM | POA: Diagnosis not present

## 2024-06-12 DIAGNOSIS — E210 Primary hyperparathyroidism: Secondary | ICD-10-CM | POA: Diagnosis not present

## 2024-06-12 DIAGNOSIS — E1122 Type 2 diabetes mellitus with diabetic chronic kidney disease: Secondary | ICD-10-CM | POA: Diagnosis not present

## 2024-06-12 DIAGNOSIS — N182 Chronic kidney disease, stage 2 (mild): Secondary | ICD-10-CM | POA: Diagnosis not present

## 2024-06-12 DIAGNOSIS — J45909 Unspecified asthma, uncomplicated: Secondary | ICD-10-CM | POA: Diagnosis not present

## 2024-06-12 DIAGNOSIS — N1832 Chronic kidney disease, stage 3b: Secondary | ICD-10-CM | POA: Diagnosis not present

## 2024-06-12 DIAGNOSIS — R0602 Shortness of breath: Secondary | ICD-10-CM | POA: Diagnosis not present

## 2024-06-12 DIAGNOSIS — F411 Generalized anxiety disorder: Secondary | ICD-10-CM | POA: Diagnosis not present

## 2024-06-12 DIAGNOSIS — C801 Malignant (primary) neoplasm, unspecified: Secondary | ICD-10-CM | POA: Diagnosis not present

## 2024-06-12 DIAGNOSIS — Z23 Encounter for immunization: Secondary | ICD-10-CM | POA: Diagnosis not present

## 2024-06-12 DIAGNOSIS — G301 Alzheimer's disease with late onset: Secondary | ICD-10-CM | POA: Diagnosis not present

## 2024-06-12 DIAGNOSIS — F02B2 Dementia in other diseases classified elsewhere, moderate, with psychotic disturbance: Secondary | ICD-10-CM | POA: Diagnosis not present

## 2024-06-12 DIAGNOSIS — Z794 Long term (current) use of insulin: Secondary | ICD-10-CM | POA: Diagnosis not present

## 2024-06-12 DIAGNOSIS — I129 Hypertensive chronic kidney disease with stage 1 through stage 4 chronic kidney disease, or unspecified chronic kidney disease: Secondary | ICD-10-CM | POA: Diagnosis not present

## 2024-06-12 DIAGNOSIS — F0280 Dementia in other diseases classified elsewhere without behavioral disturbance: Secondary | ICD-10-CM | POA: Diagnosis not present

## 2024-07-17 DIAGNOSIS — F0390 Unspecified dementia without behavioral disturbance: Secondary | ICD-10-CM | POA: Diagnosis not present

## 2024-07-17 DIAGNOSIS — Z87891 Personal history of nicotine dependence: Secondary | ICD-10-CM | POA: Diagnosis not present

## 2024-07-17 DIAGNOSIS — N182 Chronic kidney disease, stage 2 (mild): Secondary | ICD-10-CM | POA: Diagnosis not present

## 2024-07-17 DIAGNOSIS — G301 Alzheimer's disease with late onset: Secondary | ICD-10-CM | POA: Diagnosis not present

## 2024-07-17 DIAGNOSIS — Z794 Long term (current) use of insulin: Secondary | ICD-10-CM | POA: Diagnosis not present

## 2024-07-17 DIAGNOSIS — N179 Acute kidney failure, unspecified: Secondary | ICD-10-CM | POA: Diagnosis not present

## 2024-07-17 DIAGNOSIS — Z23 Encounter for immunization: Secondary | ICD-10-CM | POA: Diagnosis not present

## 2024-07-17 DIAGNOSIS — M8580 Other specified disorders of bone density and structure, unspecified site: Secondary | ICD-10-CM | POA: Diagnosis not present

## 2024-07-17 DIAGNOSIS — F02B2 Dementia in other diseases classified elsewhere, moderate, with psychotic disturbance: Secondary | ICD-10-CM | POA: Diagnosis not present

## 2024-07-17 DIAGNOSIS — E113293 Type 2 diabetes mellitus with mild nonproliferative diabetic retinopathy without macular edema, bilateral: Secondary | ICD-10-CM | POA: Diagnosis not present

## 2024-07-17 DIAGNOSIS — F411 Generalized anxiety disorder: Secondary | ICD-10-CM | POA: Diagnosis not present

## 2024-07-17 DIAGNOSIS — F03918 Unspecified dementia, unspecified severity, with other behavioral disturbance: Secondary | ICD-10-CM | POA: Diagnosis not present

## 2024-07-17 DIAGNOSIS — C801 Malignant (primary) neoplasm, unspecified: Secondary | ICD-10-CM | POA: Diagnosis not present

## 2024-07-17 DIAGNOSIS — E1110 Type 2 diabetes mellitus with ketoacidosis without coma: Secondary | ICD-10-CM | POA: Diagnosis not present

## 2024-07-17 DIAGNOSIS — I129 Hypertensive chronic kidney disease with stage 1 through stage 4 chronic kidney disease, or unspecified chronic kidney disease: Secondary | ICD-10-CM | POA: Diagnosis not present

## 2024-07-17 DIAGNOSIS — E1122 Type 2 diabetes mellitus with diabetic chronic kidney disease: Secondary | ICD-10-CM | POA: Diagnosis not present

## 2024-07-17 DIAGNOSIS — N1832 Chronic kidney disease, stage 3b: Secondary | ICD-10-CM | POA: Diagnosis not present

## 2024-07-17 DIAGNOSIS — I10 Essential (primary) hypertension: Secondary | ICD-10-CM | POA: Diagnosis not present

## 2024-07-17 DIAGNOSIS — R419 Unspecified symptoms and signs involving cognitive functions and awareness: Secondary | ICD-10-CM | POA: Diagnosis not present

## 2024-07-18 DIAGNOSIS — E1122 Type 2 diabetes mellitus with diabetic chronic kidney disease: Secondary | ICD-10-CM | POA: Diagnosis not present

## 2024-07-18 DIAGNOSIS — E871 Hypo-osmolality and hyponatremia: Secondary | ICD-10-CM | POA: Diagnosis not present

## 2024-07-18 DIAGNOSIS — Z794 Long term (current) use of insulin: Secondary | ICD-10-CM | POA: Diagnosis not present

## 2024-07-18 DIAGNOSIS — E210 Primary hyperparathyroidism: Secondary | ICD-10-CM | POA: Diagnosis not present

## 2024-07-18 DIAGNOSIS — E785 Hyperlipidemia, unspecified: Secondary | ICD-10-CM | POA: Diagnosis not present

## 2024-07-18 DIAGNOSIS — R54 Age-related physical debility: Secondary | ICD-10-CM | POA: Diagnosis not present

## 2024-07-18 DIAGNOSIS — E1110 Type 2 diabetes mellitus with ketoacidosis without coma: Secondary | ICD-10-CM | POA: Diagnosis not present

## 2024-07-18 DIAGNOSIS — N1832 Chronic kidney disease, stage 3b: Secondary | ICD-10-CM | POA: Diagnosis not present

## 2024-07-18 DIAGNOSIS — K219 Gastro-esophageal reflux disease without esophagitis: Secondary | ICD-10-CM | POA: Diagnosis not present

## 2024-07-18 DIAGNOSIS — F02811 Dementia in other diseases classified elsewhere, unspecified severity, with agitation: Secondary | ICD-10-CM | POA: Diagnosis not present

## 2024-07-18 DIAGNOSIS — F03918 Unspecified dementia, unspecified severity, with other behavioral disturbance: Secondary | ICD-10-CM | POA: Diagnosis not present

## 2024-07-18 DIAGNOSIS — M8580 Other specified disorders of bone density and structure, unspecified site: Secondary | ICD-10-CM | POA: Diagnosis not present

## 2024-07-18 DIAGNOSIS — N179 Acute kidney failure, unspecified: Secondary | ICD-10-CM | POA: Diagnosis not present

## 2024-07-18 DIAGNOSIS — E1165 Type 2 diabetes mellitus with hyperglycemia: Secondary | ICD-10-CM | POA: Diagnosis not present

## 2024-07-18 DIAGNOSIS — Z87891 Personal history of nicotine dependence: Secondary | ICD-10-CM | POA: Diagnosis not present

## 2024-07-18 DIAGNOSIS — Z91148 Patient's other noncompliance with medication regimen for other reason: Secondary | ICD-10-CM | POA: Diagnosis not present

## 2024-07-18 DIAGNOSIS — E875 Hyperkalemia: Secondary | ICD-10-CM | POA: Diagnosis not present

## 2024-07-18 DIAGNOSIS — I129 Hypertensive chronic kidney disease with stage 1 through stage 4 chronic kidney disease, or unspecified chronic kidney disease: Secondary | ICD-10-CM | POA: Diagnosis not present

## 2024-07-18 DIAGNOSIS — I10 Essential (primary) hypertension: Secondary | ICD-10-CM | POA: Diagnosis not present

## 2024-07-18 DIAGNOSIS — R509 Fever, unspecified: Secondary | ICD-10-CM | POA: Diagnosis not present

## 2024-07-18 DIAGNOSIS — F0390 Unspecified dementia without behavioral disturbance: Secondary | ICD-10-CM | POA: Diagnosis not present

## 2024-07-18 DIAGNOSIS — Z1152 Encounter for screening for COVID-19: Secondary | ICD-10-CM | POA: Diagnosis not present

## 2024-07-18 DIAGNOSIS — E1310 Other specified diabetes mellitus with ketoacidosis without coma: Secondary | ICD-10-CM | POA: Diagnosis not present

## 2024-07-18 DIAGNOSIS — G309 Alzheimer's disease, unspecified: Secondary | ICD-10-CM | POA: Diagnosis not present

## 2024-07-23 DIAGNOSIS — E1310 Other specified diabetes mellitus with ketoacidosis without coma: Secondary | ICD-10-CM | POA: Diagnosis not present

## 2024-07-25 DIAGNOSIS — E1165 Type 2 diabetes mellitus with hyperglycemia: Secondary | ICD-10-CM | POA: Diagnosis not present

## 2024-07-31 DIAGNOSIS — E210 Primary hyperparathyroidism: Secondary | ICD-10-CM | POA: Diagnosis not present

## 2024-07-31 DIAGNOSIS — Z789 Other specified health status: Secondary | ICD-10-CM | POA: Diagnosis not present

## 2024-07-31 DIAGNOSIS — N1832 Chronic kidney disease, stage 3b: Secondary | ICD-10-CM | POA: Diagnosis not present

## 2024-07-31 DIAGNOSIS — M8580 Other specified disorders of bone density and structure, unspecified site: Secondary | ICD-10-CM | POA: Diagnosis not present

## 2024-07-31 DIAGNOSIS — F32A Depression, unspecified: Secondary | ICD-10-CM | POA: Diagnosis not present

## 2024-07-31 DIAGNOSIS — E1122 Type 2 diabetes mellitus with diabetic chronic kidney disease: Secondary | ICD-10-CM | POA: Diagnosis not present

## 2024-07-31 DIAGNOSIS — N182 Chronic kidney disease, stage 2 (mild): Secondary | ICD-10-CM | POA: Diagnosis not present

## 2024-07-31 DIAGNOSIS — E559 Vitamin D deficiency, unspecified: Secondary | ICD-10-CM | POA: Diagnosis not present

## 2024-07-31 DIAGNOSIS — F02818 Dementia in other diseases classified elsewhere, unspecified severity, with other behavioral disturbance: Secondary | ICD-10-CM | POA: Diagnosis not present

## 2024-07-31 DIAGNOSIS — G301 Alzheimer's disease with late onset: Secondary | ICD-10-CM | POA: Diagnosis not present

## 2024-07-31 DIAGNOSIS — Z09 Encounter for follow-up examination after completed treatment for conditions other than malignant neoplasm: Secondary | ICD-10-CM | POA: Diagnosis not present

## 2024-07-31 DIAGNOSIS — Z794 Long term (current) use of insulin: Secondary | ICD-10-CM | POA: Diagnosis not present

## 2024-07-31 DIAGNOSIS — I10 Essential (primary) hypertension: Secondary | ICD-10-CM | POA: Diagnosis not present

## 2024-08-07 DIAGNOSIS — Z794 Long term (current) use of insulin: Secondary | ICD-10-CM | POA: Diagnosis not present

## 2024-08-07 DIAGNOSIS — E1122 Type 2 diabetes mellitus with diabetic chronic kidney disease: Secondary | ICD-10-CM | POA: Diagnosis not present

## 2024-08-07 DIAGNOSIS — N1832 Chronic kidney disease, stage 3b: Secondary | ICD-10-CM | POA: Diagnosis not present

## 2024-08-09 ENCOUNTER — Emergency Department
Admission: EM | Admit: 2024-08-09 | Discharge: 2024-08-10 | Disposition: A | Discharge status: Home / Self Care | Attending: Emergency Medicine | Admitting: Emergency Medicine

## 2024-08-09 ENCOUNTER — Emergency Department

## 2024-08-09 ENCOUNTER — Other Ambulatory Visit: Payer: Self-pay

## 2024-08-09 DIAGNOSIS — E11649 Type 2 diabetes mellitus with hypoglycemia without coma: Secondary | ICD-10-CM | POA: Diagnosis not present

## 2024-08-09 DIAGNOSIS — F0390 Unspecified dementia without behavioral disturbance: Secondary | ICD-10-CM | POA: Insufficient documentation

## 2024-08-09 DIAGNOSIS — U071 COVID-19: Secondary | ICD-10-CM | POA: Diagnosis not present

## 2024-08-09 DIAGNOSIS — I10 Essential (primary) hypertension: Secondary | ICD-10-CM | POA: Insufficient documentation

## 2024-08-09 DIAGNOSIS — R059 Cough, unspecified: Secondary | ICD-10-CM | POA: Diagnosis not present

## 2024-08-09 DIAGNOSIS — E162 Hypoglycemia, unspecified: Secondary | ICD-10-CM

## 2024-08-09 DIAGNOSIS — Z7984 Long term (current) use of oral hypoglycemic drugs: Secondary | ICD-10-CM | POA: Diagnosis not present

## 2024-08-09 DIAGNOSIS — Z79899 Other long term (current) drug therapy: Secondary | ICD-10-CM | POA: Insufficient documentation

## 2024-08-09 DIAGNOSIS — Z794 Long term (current) use of insulin: Secondary | ICD-10-CM | POA: Insufficient documentation

## 2024-08-09 LAB — CBC WITH DIFFERENTIAL/PLATELET
Abs Immature Granulocytes: 0.03 K/uL (ref 0.00–0.07)
Basophils Absolute: 0 K/uL (ref 0.0–0.1)
Basophils Relative: 0 %
Eosinophils Absolute: 0 K/uL (ref 0.0–0.5)
Eosinophils Relative: 0 %
HCT: 38.2 % (ref 36.0–46.0)
Hemoglobin: 12.4 g/dL (ref 12.0–15.0)
Immature Granulocytes: 0 %
Lymphocytes Relative: 18 %
Lymphs Abs: 1.7 K/uL (ref 0.7–4.0)
MCH: 29.6 pg (ref 26.0–34.0)
MCHC: 32.5 g/dL (ref 30.0–36.0)
MCV: 91.2 fL (ref 80.0–100.0)
Monocytes Absolute: 1 K/uL (ref 0.1–1.0)
Monocytes Relative: 11 %
Neutro Abs: 6.5 K/uL (ref 1.7–7.7)
Neutrophils Relative %: 71 %
Platelets: 246 K/uL (ref 150–400)
RBC: 4.19 MIL/uL (ref 3.87–5.11)
RDW: 13.2 % (ref 11.5–15.5)
WBC: 9.2 K/uL (ref 4.0–10.5)
nRBC: 0 % (ref 0.0–0.2)

## 2024-08-09 LAB — RESP PANEL BY RT-PCR (RSV, FLU A&B, COVID)  RVPGX2
Influenza A by PCR: NEGATIVE
Influenza B by PCR: NEGATIVE
Resp Syncytial Virus by PCR: NEGATIVE
SARS Coronavirus 2 by RT PCR: POSITIVE — AB

## 2024-08-09 LAB — BASIC METABOLIC PANEL WITH GFR
Anion gap: 13 (ref 5–15)
BUN: 26 mg/dL — ABNORMAL HIGH (ref 8–23)
CO2: 20 mmol/L — ABNORMAL LOW (ref 22–32)
Calcium: 9.4 mg/dL (ref 8.9–10.3)
Chloride: 104 mmol/L (ref 98–111)
Creatinine, Ser: 2.1 mg/dL — ABNORMAL HIGH (ref 0.44–1.00)
GFR, Estimated: 24 mL/min — ABNORMAL LOW (ref >60)
Glucose, Bld: 131 mg/dL — ABNORMAL HIGH (ref 70–99)
Potassium: 4.1 mmol/L (ref 3.5–5.1)
Sodium: 137 mmol/L (ref 135–145)

## 2024-08-09 LAB — CBG MONITORING, ED: Glucose-Capillary: 126 mg/dL — ABNORMAL HIGH (ref 70–99)

## 2024-08-09 MED ORDER — ACETAMINOPHEN 325 MG PO TABS
650.0000 mg | ORAL_TABLET | Freq: Once | ORAL | Status: AC | PRN
  Administered 2024-08-09: 650 mg via ORAL
  Filled 2024-08-09: qty 2

## 2024-08-09 NOTE — ED Triage Notes (Signed)
 Daughter reports pt's blood sugar at 1830 was 60. Pt ate peanut butter and apple juice. Daughter reports the pt was stumbling during this time. Pt insulin  was decreased earlier this week. Pt has been having hypoglycemic episodes during the night.  Daughter also reports BP was 167/131 at home prior to coming in. Daughter reports pt has had a cough x2 days. Pt denies any complaints and says she feels normal.    CBG in triage 126

## 2024-08-09 NOTE — ED Provider Notes (Signed)
 [facility] Provider Note    Event Date/Time   First MD Initiated Contact with Patient 08/09/24 2259     (approximate)   History   Hypoglycemia   HPI  Lisa Flowers is a 78 y.o. female with history of hypertension, diabetes, dementia who presents to the emergency department with cough and congestion that started yesterday and low blood sugar today.  Patient has no complaints at this time and is very eager to be discharged.  Family denies any vomiting, diarrhea, confusion from baseline, falls, changes in medication.  Patient ambulating around the room without difficulty.  Daughter reports she has been eating and drinking a little bit less today.  Blood sugar at home was 60.   History provided by patient, daughter.    Past Medical History:  Diagnosis Date   Diabetes mellitus without complication (HCC)    Hypertension     No past surgical history on file.  MEDICATIONS:  Prior to Admission medications   Medication Sig Start Date End Date Taking? Authorizing Provider  amLODipine  (NORVASC ) 10 MG tablet Take 10 mg by mouth daily.    [provider]  busPIRone  (BUSPAR ) 5 MG tablet Take 5 mg by mouth 3 (three) times daily.    [provider]  donepezil (ARICEPT) 5 MG tablet Take 5 mg by mouth at bedtime.    [provider]  insulin  aspart protamine- aspart (NOVOLOG  MIX 70/30) (70-30) 100 UNIT/ML injection Inject 50-70 Units into the skin 2 (two) times daily with a meal. 70 units qam 50 units qpm    [provider]  JARDIANCE  10 MG TABS tablet Take 10 mg by mouth daily.    [provider]  lisinopril  (ZESTRIL ) 20 MG tablet Take 1 tablet by mouth daily. 08/30/22 12/01/23  [provider]  omeprazole (PRILOSEC) 20 MG capsule Take 20 mg by mouth daily. 08/24/22   [provider]  pravastatin (PRAVACHOL) 20 MG tablet Take 20 mg by mouth daily.    [provider]    Physical Exam   Triage  Vital Signs: ED Triage Vitals  Encounter Vitals Group     BP 08/09/24 1851 (!) 148/78     Girls Systolic BP Percentile --      Girls Diastolic BP Percentile --      Boys Systolic BP Percentile --      Boys Diastolic BP Percentile --      Pulse Rate 08/09/24 1851 (!) 109     Resp 08/09/24 1851 18     Temp 08/09/24 1851 (!) 100.4 F (38 C)     Temp Source 08/09/24 2311 Oral     SpO2 08/09/24 1851 100 %     Weight --      Height --      Head Circumference --      Peak Flow --      Pain Score 08/09/24 1852 0     Pain Loc --      Pain Education --      Exclude from Growth Chart --     Most recent vital signs: Vitals:   08/09/24 2330 08/10/24 0000  BP: (!) 145/74 (!) 145/67  Pulse: 89 83  Resp:    Temp:    SpO2: 96% 97%    CONSTITUTIONAL: Alert, responds appropriately to questions.  Confused, agitated, elderly HEAD: Normocephalic, atraumatic EYES: Conjunctivae clear, pupils appear equal, sclera nonicteric ENT: normal nose; moist mucous membranes NECK: Supple, normal ROM CARD: RRR;  S1 and S2 appreciated RESP: Normal chest excursion without splinting or tachypnea; breath sounds clear and equal bilaterally; no wheezes, no rhonchi, no rales, no hypoxia or respiratory distress, speaking full sentences ABD/GI: Non-distended; soft, non-tender, no rebound, no guarding, no peritoneal signs BACK: The back appears normal EXT: Normal ROM in all joints; no deformity noted, no edema SKIN: Normal color for age and race; warm; no rash on exposed skin NEURO: Moves all extremities equally, normal speech, walking around the room without difficulty PSYCH: Agitated   ED Results / Procedures / Treatments   LABS: (all labs ordered are listed, but only abnormal results are displayed) Labs Reviewed  RESP PANEL BY RT-PCR (RSV, FLU A&B, COVID)  RVPGX2 - Abnormal; Notable for the following components:      Result Value   SARS Coronavirus 2 by RT PCR POSITIVE (*)    All other components within  normal limits  BASIC METABOLIC PANEL WITH GFR - Abnormal; Notable for the following components:   CO2 20 (*)    Glucose, Bld 131 (*)    BUN 26 (*)    Creatinine, Ser 2.10 (*)    GFR, Estimated 24 (*)    All other components within normal limits  URINALYSIS, W/ REFLEX TO CULTURE (INFECTION SUSPECTED) - Abnormal; Notable for the following components:   Color, Urine STRAW (*)    APPearance CLEAR (*)    Bacteria, UA FEW (*)    All other components within normal limits  CBG MONITORING, ED - Abnormal; Notable for the following components:   Glucose-Capillary 126 (*)    All other components within normal limits  CBG MONITORING, ED - Abnormal; Notable for the following components:   Glucose-Capillary 127 (*)    All other components within normal limits  CBC WITH DIFFERENTIAL/PLATELET     EKG:     RADIOLOGY: My personal review and interpretation of imaging: Chest x-ray clear.  I have personally reviewed all radiology reports.   DG Chest 2 View Result Date: 08/09/2024 CLINICAL DATA:  Cough. EXAM: CHEST - 2 VIEW COMPARISON:  None Available. FINDINGS: No focal consolidation, pleural effusion or pneumothorax. The cardiac silhouette is within normal limits. No acute osseous pathology. IMPRESSION: No active cardiopulmonary disease. Electronically Signed   By: Vanetta Chou M.D.   On: 08/09/2024 19:35     PROCEDURES:  Critical Care performed: Yes, see critical care procedure note(s)   CRITICAL CARE Performed by: Josette Merlin Ege   Total critical care time: 30 minutes  Critical care time was exclusive of separately billable procedures and treating other patients.  Critical care was necessary to treat or prevent imminent or life-threatening deterioration.  Critical care was time spent personally by me on the following activities: development of treatment plan with patient and/or surrogate as well as nursing, discussions with consultants, evaluation of patient's response to treatment,  examination of patient, obtaining history from patient or surrogate, ordering and performing treatments and interventions, ordering and review of laboratory studies, ordering and review of radiographic studies, pulse oximetry and re-evaluation of patient's condition.   Procedures    IMPRESSION / MDM / ASSESSMENT AND PLAN / ED COURSE  I reviewed the triage vital signs and the nursing notes.    Patient here for cough, congestion and hypoglycemia.  Now back to her baseline.  Requesting discharge home.  The patient is on the cardiac monitor to evaluate for evidence of arrhythmia and/or significant heart rate changes.   DIFFERENTIAL DIAGNOSIS (includes but not limited to):   Hypoglycemia,  decreased oral intake, viral upper respiratory infection, pneumonia, UTI, dehydration, dementia, less likely ACS, PE, pneumothorax, CHF   Patient's presentation is most consistent with acute presentation with potential threat to life or bodily function.   PLAN: Blood sugars here have been stable in the 120s.  She has been able to eat and drink today.  She is at her baseline.  She is very agitated and requesting discharge.  Daughter reports she has a history of dementia.  No focal neurodeficits.  She is moving all of her extremities and ambulating without difficulty here.  No chest pain or shortness of breath.  No hypoxia.  Daughter does report a cold for the past 2 days.  She has tested positive for COVID-19.  She is at higher risk due to age and comorbidities but has history of chronic kidney disease.  Will start her on molnupiravir.  Chest x-ray reviewed and interpreted by myself and radiologist and shows no pneumonia.  No hypoxia or increased work of breathing today.  Hemoglobin normal.  Stable chronic kidney disease.  Normal potassium.  Urinalysis is pending.  Family ready for discharge but I will follow-up on these results to ensure there is no sign of UTI that needs treatment.   MEDICATIONS GIVEN IN  ED: Medications  acetaminophen  (TYLENOL ) tablet 650 mg (650 mg Oral Given 08/09/24 1856)     ED COURSE: Urine shows no sign of infection or dehydration.  Patient discharged home with instructions for supportive care.  Repeat blood sugar stable.   At this time, I do not feel there is any life-threatening condition present. I reviewed all nursing notes, vitals, pertinent previous records.  All lab and urine results, EKGs, imaging ordered have been independently reviewed and interpreted by myself.  I reviewed all available radiology reports from any imaging ordered this visit.  Based on my assessment, I feel the patient is safe to be discharged home without further emergent workup and can continue workup as an outpatient as needed. Discussed all findings, treatment plan as well as usual and customary return precautions.  They verbalize understanding and are comfortable with this plan.  Outpatient follow-up has been provided as needed.  All questions have been answered.    CONSULTS:  none   OUTSIDE RECORDS REVIEWED: Reviewed recent endocrinology note.       FINAL CLINICAL IMPRESSION(S) / ED DIAGNOSES   Final diagnoses:  COVID-19  Hypoglycemia     Rx / DC Orders   ED Discharge Orders          Ordered    molnupiravir EUA (LAGEVRIO) 200 mg CAPS capsule  2 times daily        08/10/24 0027             Note:  This document was prepared using Dragon voice recognition software and may include unintentional dictation errors.   Hanny Elsberry, Josette SAILOR, DO 08/10/24 [phone]

## 2024-08-10 DIAGNOSIS — E11649 Type 2 diabetes mellitus with hypoglycemia without coma: Secondary | ICD-10-CM | POA: Diagnosis not present

## 2024-08-10 LAB — URINALYSIS, W/ REFLEX TO CULTURE (INFECTION SUSPECTED)
Bilirubin Urine: NEGATIVE
Glucose, UA: NEGATIVE mg/dL
Hgb urine dipstick: NEGATIVE
Ketones, ur: NEGATIVE mg/dL
Leukocytes,Ua: NEGATIVE
Nitrite: NEGATIVE
Protein, ur: NEGATIVE mg/dL
Specific Gravity, Urine: 1.011 (ref 1.005–1.030)
pH: 5 (ref 5.0–8.0)

## 2024-08-10 LAB — CBG MONITORING, ED: Glucose-Capillary: 127 mg/dL — ABNORMAL HIGH (ref 70–99)

## 2024-08-10 MED ORDER — MOLNUPIRAVIR EUA 200MG CAPSULE: 4.0000 | ORAL_CAPSULE | Freq: Two times a day (BID) | ORAL | 0 refills | Status: AC

## 2024-08-10 NOTE — Discharge Instructions (Addendum)
You have been diagnosed with COVID 19.  This is a virus that can cause many different symptoms and can be extremely contagious.    You may be eligible for outpatient antiviral treatments for COVID 19 such as Paxlovid, Molnupiravir if you are within the first 5 days of symptoms. You do not need antibiotics for COVID 19 since it is a virus.  You may use over the counter medications to help manage your symptoms at home.    You may alternate Tylenol 1000 mg every 6 hours as needed for pain, fever (as long as you have no history of liver dysfunction) and Ibuprofen 800 mg every 8 hours as needed for pain, fever (as long as you have no history of kidney dysfunction).  Please take Ibuprofen with food.  Do not take more than 4000 mg of Tylenol (acetaminophen) in a 24 hour period.  Please rest and drink plenty of fluids.  You will need to quarantine from others for five days (first day of symptoms is DAY ZERO).  If your symptoms are improving or resolved at the end of this time frame, you may come out of quarantine but will need to wear a well fitted mask when around others for the next 5 days.   The best way to protect yourself and others from COVID 19 and potential long term complications is to be vaccinated and receive boosters as recommended by the [facility] and your primary care provider.  If you develop shortness of breath, blue lips or blue fingertips, vomiting that does not stop, chest pain, confusion, become severely weak or feel you may pass out, please return to the closest emergency department.

## 2024-09-04 DIAGNOSIS — E1165 Type 2 diabetes mellitus with hyperglycemia: Secondary | ICD-10-CM | POA: Diagnosis not present

## 2024-09-04 DIAGNOSIS — E785 Hyperlipidemia, unspecified: Secondary | ICD-10-CM | POA: Diagnosis not present

## 2024-09-04 DIAGNOSIS — E559 Vitamin D deficiency, unspecified: Secondary | ICD-10-CM | POA: Diagnosis not present

## 2024-09-04 DIAGNOSIS — Z794 Long term (current) use of insulin: Secondary | ICD-10-CM | POA: Diagnosis not present

## 2024-09-04 DIAGNOSIS — I10 Essential (primary) hypertension: Secondary | ICD-10-CM | POA: Diagnosis not present

## 2024-09-04 DIAGNOSIS — R052 Subacute cough: Secondary | ICD-10-CM | POA: Diagnosis not present

## 2024-09-04 DIAGNOSIS — F02B2 Dementia in other diseases classified elsewhere, moderate, with psychotic disturbance: Secondary | ICD-10-CM | POA: Diagnosis not present

## 2024-09-04 DIAGNOSIS — E210 Primary hyperparathyroidism: Secondary | ICD-10-CM | POA: Diagnosis not present

## 2024-09-04 DIAGNOSIS — N182 Chronic kidney disease, stage 2 (mild): Secondary | ICD-10-CM | POA: Diagnosis not present

## 2024-09-04 DIAGNOSIS — E1122 Type 2 diabetes mellitus with diabetic chronic kidney disease: Secondary | ICD-10-CM | POA: Diagnosis not present

## 2024-09-04 DIAGNOSIS — Z09 Encounter for follow-up examination after completed treatment for conditions other than malignant neoplasm: Secondary | ICD-10-CM | POA: Diagnosis not present

## 2024-09-04 DIAGNOSIS — G301 Alzheimer's disease with late onset: Secondary | ICD-10-CM | POA: Diagnosis not present

## 2024-09-04 DIAGNOSIS — E113293 Type 2 diabetes mellitus with mild nonproliferative diabetic retinopathy without macular edema, bilateral: Secondary | ICD-10-CM | POA: Diagnosis not present

## 2024-09-04 DIAGNOSIS — N1832 Chronic kidney disease, stage 3b: Secondary | ICD-10-CM | POA: Diagnosis not present
